# Patient Record
Sex: Male | Born: 1970 | Race: Black or African American | Hispanic: No | Marital: Married | Smoking: Current every day smoker
Health system: Southern US, Community
[De-identification: ages and names within clinical notes are randomized; demographics above are authoritative.]

## PROBLEM LIST (undated history)

## (undated) DIAGNOSIS — J45909 Unspecified asthma, uncomplicated: Secondary | ICD-10-CM

## (undated) DIAGNOSIS — I10 Essential (primary) hypertension: Secondary | ICD-10-CM

## (undated) DIAGNOSIS — F329 Major depressive disorder, single episode, unspecified: Secondary | ICD-10-CM

## (undated) DIAGNOSIS — F32A Depression, unspecified: Secondary | ICD-10-CM

## (undated) HISTORY — PX: HERNIA REPAIR: SHX51

---
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
## 2020-01-10 ENCOUNTER — Other Ambulatory Visit: Payer: Self-pay

## 2020-01-10 ENCOUNTER — Emergency Department (HOSPITAL_COMMUNITY)
Admission: EM | Admit: 2020-01-10 | Discharge: 2020-01-10 | Disposition: A | Discharge status: Home / Self Care | Payer: Self-pay | Attending: Emergency Medicine | Admitting: Emergency Medicine

## 2020-01-10 ENCOUNTER — Encounter (HOSPITAL_COMMUNITY): Payer: Self-pay

## 2020-01-10 DIAGNOSIS — Z20822 Contact with and (suspected) exposure to covid-19: Secondary | ICD-10-CM | POA: Insufficient documentation

## 2020-01-10 DIAGNOSIS — J069 Acute upper respiratory infection, unspecified: Secondary | ICD-10-CM | POA: Insufficient documentation

## 2020-01-10 DIAGNOSIS — J45909 Unspecified asthma, uncomplicated: Secondary | ICD-10-CM | POA: Insufficient documentation

## 2020-01-10 DIAGNOSIS — F17210 Nicotine dependence, cigarettes, uncomplicated: Secondary | ICD-10-CM | POA: Insufficient documentation

## 2020-01-10 HISTORY — DX: Unspecified asthma, uncomplicated: J45.909

## 2020-01-10 HISTORY — DX: Depression, unspecified: F32.A

## 2020-01-10 LAB — POC SARS CORONAVIRUS 2 AG -  ED: SARS Coronavirus 2 Ag: NEGATIVE

## 2020-01-10 LAB — SARS CORONAVIRUS 2 (TAT 6-24 HRS): SARS Coronavirus 2: NEGATIVE

## 2020-01-10 NOTE — ED Triage Notes (Signed)
Patient c/o a non productive cough, chills, fatigue/weakness since yesterday.

## 2020-01-10 NOTE — ED Provider Notes (Signed)
[facility] DEPT Provider Note   CSN: [id number] Arrival date & time: 01/10/20  6812     History Chief Complaint  Patient presents with  . Chills  . Fatigue  . Cough    Mason Zuniga is a 49 y.o. male.  HPI Patient presents with cough chills and fatigue.  Began yesterday.  No known sick contacts.  History of asthma.  No production with cough.  Has not had to use inhaler.  Patient is worried he could have Covid.  Although he has had no known contacts.  No chest pain.    Past Medical History:  Diagnosis Date  . Asthma   . Depression     There are no problems to display for this patient.   Past Surgical History:  Procedure Laterality Date  . HERNIA REPAIR         Family History  Problem Relation Age of Onset  . Hypertension Mother     Social History   Tobacco Use  . Smoking status: Current Every Day Smoker    Packs/day: 1.00    Types: Cigarettes  . Smokeless tobacco: Never Used  Substance Use Topics  . Alcohol use: Not Currently  . Drug use: Not Currently    Home Medications Prior to Admission medications   Not on File    Allergies    Zoloft [sertraline]  Review of Systems   Review of Systems  Constitutional: Positive for appetite change, chills and fatigue.  HENT: Negative for congestion.   Respiratory: Positive for cough and shortness of breath.   Cardiovascular: Negative for chest pain and leg swelling.  Gastrointestinal: Negative for abdominal pain.  Genitourinary: Negative for flank pain.  Musculoskeletal: Negative for back pain.  Neurological: Negative for weakness.  Psychiatric/Behavioral: Negative for confusion.    Physical Exam Updated Vital Signs BP 139/89 (BP Location: Right Arm)   Pulse 76   Temp 98.2 F (36.8 C) (Oral)   Resp 16   Ht 5\' 11"  (1.803 m)   Wt 83.9 kg   SpO2 99%   BMI 25.80 kg/m   Physical Exam Vitals and nursing note reviewed.  HENT:     Head: Atraumatic.  Cardiovascular:      Rate and Rhythm: Regular rhythm.  Pulmonary:     Breath sounds: No wheezing, rhonchi or rales.  Abdominal:     Tenderness: There is no abdominal tenderness.  Musculoskeletal:     Cervical back: Neck supple.     Right lower leg: No edema.     Left lower leg: No edema.  Skin:    General: Skin is warm.     Capillary Refill: Capillary refill takes less than 2 seconds.  Neurological:     Mental Status: He is alert and oriented to person, place, and time.  Psychiatric:        Mood and Affect: Mood normal.     ED Results / Procedures / Treatments   Labs (all labs ordered are listed, but only abnormal results are displayed) Labs Reviewed  SARS CORONAVIRUS 2 (TAT 6-24 HRS)  POC SARS CORONAVIRUS 2 AG -  ED    EKG None  Radiology No results found.  Procedures Procedures (including critical care time)  Medications Ordered in ED Medications - No data to display  ED Course  I have reviewed the triage vital signs and the nursing notes.  Pertinent labs & imaging results that were available during my care of the patient were reviewed by me and considered  in my medical decision making (see chart for details).    MDM Rules/Calculators/A&P                      Patient with URI symptoms.  No known Covid exposure.  Negative antigen test.  PCR test ordered.  Well-appearing and appears stable for discharge.  Instructed to isolate.  Discharge. Final Clinical Impression(s) / ED Diagnoses Final diagnoses:  Upper respiratory tract infection, unspecified type    Rx / DC Orders ED Discharge Orders    None       Benjiman Core, MD 01/10/20 [phone]

## 2020-01-10 NOTE — ED Notes (Signed)
Pt verbalizes understanding of DC instructions. Pt belongings returned and is ambulatory out of ED.  

## 2020-02-24 ENCOUNTER — Emergency Department (HOSPITAL_BASED_OUTPATIENT_CLINIC_OR_DEPARTMENT_OTHER)
Admission: EM | Admit: 2020-02-24 | Discharge: 2020-02-25 | Disposition: A | Discharge status: Home / Self Care | Payer: Self-pay | Attending: Emergency Medicine | Admitting: Emergency Medicine

## 2020-02-24 ENCOUNTER — Emergency Department (HOSPITAL_BASED_OUTPATIENT_CLINIC_OR_DEPARTMENT_OTHER): Payer: Self-pay

## 2020-02-24 ENCOUNTER — Encounter (HOSPITAL_BASED_OUTPATIENT_CLINIC_OR_DEPARTMENT_OTHER): Payer: Self-pay | Admitting: Emergency Medicine

## 2020-02-24 ENCOUNTER — Other Ambulatory Visit: Payer: Self-pay

## 2020-02-24 DIAGNOSIS — Z20822 Contact with and (suspected) exposure to covid-19: Secondary | ICD-10-CM | POA: Diagnosis not present

## 2020-02-24 DIAGNOSIS — I959 Hypotension, unspecified: Secondary | ICD-10-CM | POA: Diagnosis not present

## 2020-02-24 DIAGNOSIS — R55 Syncope and collapse: Secondary | ICD-10-CM | POA: Insufficient documentation

## 2020-02-24 DIAGNOSIS — R531 Weakness: Secondary | ICD-10-CM | POA: Diagnosis not present

## 2020-02-24 DIAGNOSIS — J45909 Unspecified asthma, uncomplicated: Secondary | ICD-10-CM | POA: Insufficient documentation

## 2020-02-24 DIAGNOSIS — R001 Bradycardia, unspecified: Secondary | ICD-10-CM | POA: Diagnosis not present

## 2020-02-24 DIAGNOSIS — F12929 Cannabis use, unspecified with intoxication, unspecified: Secondary | ICD-10-CM | POA: Insufficient documentation

## 2020-02-24 DIAGNOSIS — F12920 Cannabis use, unspecified with intoxication, uncomplicated: Secondary | ICD-10-CM

## 2020-02-24 DIAGNOSIS — F12120 Cannabis abuse with intoxication, uncomplicated: Secondary | ICD-10-CM | POA: Diagnosis not present

## 2020-02-24 DIAGNOSIS — F17210 Nicotine dependence, cigarettes, uncomplicated: Secondary | ICD-10-CM | POA: Insufficient documentation

## 2020-02-24 LAB — RAPID URINE DRUG SCREEN, HOSP PERFORMED
Amphetamines: NOT DETECTED
Barbiturates: NOT DETECTED
Benzodiazepines: NOT DETECTED
Cocaine: NOT DETECTED
Opiates: NOT DETECTED
Tetrahydrocannabinol: POSITIVE — AB

## 2020-02-24 LAB — CBC
HCT: 43.3 % (ref 39.0–52.0)
Hemoglobin: 14 g/dL (ref 13.0–17.0)
MCH: 28.7 pg (ref 26.0–34.0)
MCHC: 32.3 g/dL (ref 30.0–36.0)
MCV: 88.7 fL (ref 80.0–100.0)
Platelets: 155 10*3/uL (ref 150–400)
RBC: 4.88 MIL/uL (ref 4.22–5.81)
RDW: 13.1 % (ref 11.5–15.5)
WBC: 6 10*3/uL (ref 4.0–10.5)
nRBC: 0 % (ref 0.0–0.2)

## 2020-02-24 LAB — BASIC METABOLIC PANEL
Anion gap: 8 (ref 5–15)
BUN: 11 mg/dL (ref 6–20)
CO2: 27 mmol/L (ref 22–32)
Calcium: 8.1 mg/dL — ABNORMAL LOW (ref 8.9–10.3)
Chloride: 104 mmol/L (ref 98–111)
Creatinine, Ser: 1.08 mg/dL (ref 0.61–1.24)
GFR calc Af Amer: >60 mL/min (ref >60)
GFR calc non Af Amer: >60 mL/min (ref >60)
Glucose, Bld: 138 mg/dL — ABNORMAL HIGH (ref 70–99)
Potassium: 3.6 mmol/L (ref 3.5–5.1)
Sodium: 139 mmol/L (ref 135–145)

## 2020-02-24 LAB — TROPONIN I (HIGH SENSITIVITY): Troponin I (High Sensitivity): <2 ng/L (ref <18)

## 2020-02-24 MED ORDER — SODIUM CHLORIDE 0.9 % IV BOLUS
1000.0000 mL | Freq: Once | INTRAVENOUS | Status: AC
  Administered 2020-02-24: 1000 mL via INTRAVENOUS

## 2020-02-24 NOTE — Discharge Instructions (Addendum)
You were seen in the ER today after an episode of lightheadedness and near loss of consciousness.  Your workup was reassuring in the ER, and did not reveal any life-threatening emergencies.  Specifically, we did not see signs of a heart attack, arrhythmia, anemia, or significant dehydration in your bloodwork.  It is possible your dizziness and weakness was related to marijuana, which can have these kind of effects.  We discussed testing you for COVID because of your fatigue and loss of appetite - that test will result in 1-2 days.  I advised that you rest at home and keep hydrated.  I also advised you follow up with a cardiologist for your episode. Your heart doctor may want to perform additional testing, like an echocardiogram.  You should try to schedule an appointment in the 5-7 days if they have availability.

## 2020-02-24 NOTE — ED Triage Notes (Signed)
Reports being at family get together this evening when he had a sudden onset of lethargy and hypotension.  Endorses smoking 1 blunt tonight.  Hx of drug overdose.  EMS reports pinpoint pupils.  Denies any other drug use tonight.  Reports drinking 1 smirnoff.

## 2020-02-24 NOTE — ED Provider Notes (Signed)
[facility] EMERGENCY DEPARTMENT Provider Note   CSN: [id number] Arrival date & time: 02/24/20  2204     History CC: fatigue  Rocky Gladden is a 49 y.o. male presenting to the ED with weakness and near-syncope.  He reports he's felt tired all day.  He was at a birthday celebration with his daughters and his "wife" or partner Pearlean Brownie [phone]), and that he had "two hits off a blunt," then was standing up to go outside to smoke a cigarette, and became very lightheaded and fell over.  His wife states "he looked like a zombie, really groggy, and just fell over."  She says he was "sweaty all over" after his fall, but there was no tonic-clonic activity.  He has no hx of seizures.  He denies he has ever had syncope before.  He believes he's dehydrated, and tells me he doesn't drink much water.  He denies any other drug use tonight, or drinking any alcohol.  He denies any cardiac hx .    In the Ed the patient is asymptomatic.  HPI     Past Medical History:  Diagnosis Date  . Asthma   . Depression     There are no problems to display for this patient.   Past Surgical History:  Procedure Laterality Date  . HERNIA REPAIR         Family History  Problem Relation Age of Onset  . Hypertension Mother     Social History   Tobacco Use  . Smoking status: Current Every Day Smoker    Packs/day: 1.00    Types: Cigarettes  . Smokeless tobacco: Never Used  Substance Use Topics  . Alcohol use: Yes    Alcohol/week: 3.0 standard drinks    Types: 3 Cans of beer per week    Comment: occasionally  . Drug use: Yes    Types: Marijuana, Cocaine    Home Medications Prior to Admission medications   Not on File    Allergies    Zoloft [sertraline]  Review of Systems   Review of Systems  Constitutional: Positive for fatigue. Negative for chills and fever.  Eyes: Negative for pain and visual disturbance.  Respiratory: Negative for cough and shortness of breath.     Cardiovascular: Negative for chest pain and palpitations.  Gastrointestinal: Negative for abdominal pain and vomiting.  Skin: Negative for pallor and rash.  Neurological: Positive for light-headedness. Negative for seizures, syncope, facial asymmetry, weakness and numbness.  All other systems reviewed and are negative.   Physical Exam Updated Vital Signs BP 114/68   Pulse (!) 55   Temp (!) 97.3 F (36.3 C) (Oral)   Resp 13   Ht 5\' 11"  (1.803 m)   Wt 83.9 kg   SpO2 96%   BMI 25.80 kg/m   Physical Exam Vitals and nursing note reviewed.  Constitutional:      Appearance: He is well-developed.  HENT:     Head: Normocephalic and atraumatic. No raccoon eyes, abrasion, masses, right periorbital erythema or left periorbital erythema.     Jaw: There is normal jaw occlusion.      Comments: Cyst on forehead (chronic) Eyes:     Conjunctiva/sclera: Conjunctivae normal.     Comments: Pinpoint pupils bilaterally  Cardiovascular:     Rate and Rhythm: Normal rate and regular rhythm.     Pulses: Normal pulses.  Pulmonary:     Effort: Pulmonary effort is normal. No respiratory distress.     Breath sounds:  Normal breath sounds.  Abdominal:     General: There is no distension.     Palpations: Abdomen is soft.     Tenderness: There is no abdominal tenderness.  Musculoskeletal:     Cervical back: Neck supple.  Skin:    General: Skin is warm and dry.  Neurological:     General: No focal deficit present.     Mental Status: He is alert and oriented to person, place, and time.  Psychiatric:        Mood and Affect: Mood normal.        Behavior: Behavior normal.     ED Results / Procedures / Treatments   Labs (all labs ordered are listed, but only abnormal results are displayed) Labs Reviewed  RAPID URINE DRUG SCREEN, HOSP PERFORMED - Abnormal; Notable for the following components:      Result Value   Tetrahydrocannabinol POSITIVE (*)    All other components within normal limits   BASIC METABOLIC PANEL - Abnormal; Notable for the following components:   Glucose, Bld 138 (*)    Calcium 8.1 (*)    All other components within normal limits  NOVEL CORONAVIRUS, NAA (HOSP ORDER, SEND-OUT TO REF LAB; TAT 18-24 HRS)  CBC  TROPONIN I (HIGH SENSITIVITY)    EKG EKG Interpretation  Date/Time:  Saturday February 24 2020 22:16:52 EST Ventricular Rate:  58 PR Interval:    QRS Duration: 75 QT Interval:  417 QTC Calculation: 410 R Axis:   77 Text Interpretation: Sinus rhythm Prolonged PR interval Consider left ventricular hypertrophy No STEMI Confirmed by Alvester Chou [phone]) on 02/24/2020 10:37:45 PM   Radiology DG Chest Portable 1 View  Result Date: 02/24/2020 CLINICAL DATA:  Near syncope, lethargy EXAM: PORTABLE CHEST 1 VIEW COMPARISON:  None. FINDINGS: The heart size and mediastinal contours are within normal limits. Both lungs are clear. The visualized skeletal structures are unremarkable. IMPRESSION: No active disease. Electronically Signed   By: Sharlet Salina M.D.   On: 02/24/2020 22:46    Procedures Procedures (including critical care time)  Medications Ordered in ED Medications  sodium chloride 0.9 % bolus 1,000 mL (1,000 mLs Intravenous New Bag/Given 02/24/20 2240)    ED Course  I have reviewed the triage vital signs and the nursing notes.  Pertinent labs & imaging results that were available during my care of the patient were reviewed by me and considered in my medical decision making (see chart for details).  49 yo male presenting to ED with near-syncope, which occurred after standing up from sitting, and in the setting of recent smoking marijuana for the first time in a long time.  Orthostatic VS unremarkable here, but patient also received IVF from EMS prior to arrival.    He is asymptomatic here, looks tired but otherwise has no acute findings on physical exam No evidence of aortic stenosis ECG benign, trop undetectable, DG chest clear No  anemia No significant dehydration per labwork  Suspect this was likely marijuana-induced, with some orthostasis earlier tonight  He is low risk for near-syncope per the [place] syncope rule (score of 0).  I advised that he follow up with a cardiologist regardless for outpatient evaluation in the next week, and he told me he would do so.  Advised rest for the next few days.  We'll also do a covid send-out test as he has had a loss of appetite and fatigue all day.  Esli Jernigan was evaluated in Emergency Department on 02/24/2020 for the symptoms  described in the history of present illness. He was evaluated in the context of the global COVID-19 pandemic, which necessitated consideration that the patient might be at risk for infection with the SARS-CoV-2 virus that causes COVID-19. Institutional protocols and algorithms that pertain to the evaluation of patients at risk for COVID-19 are in a state of rapid change based on information released by regulatory bodies including the CDC and federal and state organizations. These policies and algorithms were followed during the patient's care in the ED.   Final Clinical Impression(s) / ED Diagnoses Final diagnoses:  Near syncope  Cannabis intoxication without complication [facility])    Rx / DC Orders ED Discharge Orders    None       Azion Centrella, Carola Rhine, MD 02/24/20 2339

## 2020-02-25 LAB — SARS CORONAVIRUS 2 (TAT 6-24 HRS): SARS Coronavirus 2: NEGATIVE

## 2020-02-25 NOTE — ED Notes (Signed)
Pt's wife giving him a ride home. Assisted pt to car in wheelchair.

## 2020-02-29 ENCOUNTER — Ambulatory Visit (INDEPENDENT_AMBULATORY_CARE_PROVIDER_SITE_OTHER): Payer: BC Managed Care – PPO | Admitting: Cardiology

## 2020-02-29 ENCOUNTER — Other Ambulatory Visit: Payer: Self-pay

## 2020-02-29 ENCOUNTER — Encounter: Payer: Self-pay | Admitting: *Deleted

## 2020-02-29 ENCOUNTER — Encounter: Payer: Self-pay | Admitting: Cardiology

## 2020-02-29 VITALS — BP 120/78 | HR 63 | Temp 97.0°F | Ht 71.0 in | Wt 183.0 lb

## 2020-02-29 DIAGNOSIS — R9431 Abnormal electrocardiogram [ECG] [EKG]: Secondary | ICD-10-CM | POA: Diagnosis not present

## 2020-02-29 DIAGNOSIS — R55 Syncope and collapse: Secondary | ICD-10-CM | POA: Diagnosis not present

## 2020-02-29 DIAGNOSIS — I10 Essential (primary) hypertension: Secondary | ICD-10-CM | POA: Diagnosis not present

## 2020-02-29 DIAGNOSIS — R0681 Apnea, not elsewhere classified: Secondary | ICD-10-CM | POA: Diagnosis not present

## 2020-02-29 DIAGNOSIS — Z720 Tobacco use: Secondary | ICD-10-CM

## 2020-02-29 NOTE — Progress Notes (Signed)
Cardiology Office Note:    Date:  02/29/2020   ID:  Mason Zuniga, DOB 03/16/1971, MRN [id number]  PCP:  Patient, No Pcp Per  Cardiologist:  No primary care provider on file.  Electrophysiologist:  None   Referring MD: Terald Sleeper, MD   Chief Complaint  Patient presents with  . Loss of Consciousness    History of Present Illness:    Mason Zuniga is a 49 y.o. male with a hx of asthma, depression, hypertension who presents for an ED follow-up for syncope.  He had a recent syncopal episode.  He was at his daughter's birthday party and reports that he drank a few shots and used marijuana for the first time in years.  Stood up to walk outside to smoke a cigarette and passed out.  Family witnessed him passing out.  Reports that he was out for a few seconds.  Came to you and tried to stand up and then passed out again.  Both episodes occurred with position change.  In the ED, he was asymptomatic.  Orthostatics were negative, but he had received IV fluids from EMS.  Troponin was undetectable.  Labs unremarkable.  He denies any chest pain, dyspnea, or palpitations preceding the episode.  He reports he is usually very active, will play basketball on occasion.  Denies any exertional chest pain.  Does report he gets dyspneic with playing basketball, will use his inhaler and improves.  Denies any palpitations.  He had 1 prior syncopal episode, 20 years ago at a club.  Reports that he had been smoking marijuana that night as well.  He was in prison for 20 years and was released last year.  He reports that his wife notes that he stops breathing at night.  Smokes 1ppd, stopped for 20 years while in prison, started smoking again last year.  Father died of MI in 68s.    Past Medical History:  Diagnosis Date  . Asthma   . Depression     Past Surgical History:  Procedure Laterality Date  . HERNIA REPAIR      Current Medications: No outpatient medications have been marked as taking for the 02/29/20  encounter (Office Visit) with Little Ishikawa, MD.     Allergies:   Zoloft [sertraline]   Social History   Socioeconomic History  . Marital status: Single    Spouse name: Not on file  . Number of children: Not on file  . Years of education: Not on file  . Highest education level: Not on file  Occupational History  . Not on file  Tobacco Use  . Smoking status: Current Every Day Smoker    Packs/day: 1.00    Types: Cigarettes  . Smokeless tobacco: Never Used  Substance and Sexual Activity  . Alcohol use: Yes    Alcohol/week: 3.0 standard drinks    Types: 3 Cans of beer per week    Comment: occasionally  . Drug use: Yes    Types: Marijuana, Cocaine  . Sexual activity: Not on file  Other Topics Concern  . Not on file  Social History Narrative  . Not on file   Social Determinants of Health   Financial Resource Strain:   . Difficulty of Paying Living Expenses: Not on file  Food Insecurity:   . Worried About Programme researcher, broadcasting/film/video in the Last Year: Not on file  . Ran Out of Food in the Last Year: Not on file  Transportation Needs:   . Lack of  Transportation (Medical): Not on file  . Lack of Transportation (Non-Medical): Not on file  Physical Activity:   . Days of Exercise per Week: Not on file  . Minutes of Exercise per Session: Not on file  Stress:   . Feeling of Stress : Not on file  Social Connections:   . Frequency of Communication with Friends and Family: Not on file  . Frequency of Social Gatherings with Friends and Family: Not on file  . Attends Religious Services: Not on file  . Active Member of Clubs or Organizations: Not on file  . Attends Banker Meetings: Not on file  . Marital Status: Not on file     Family History: The patient's family history includes Hypertension in his mother.  ROS:   Please see the history of present illness.     All other systems reviewed and are negative.  EKGs/Labs/Other Studies Reviewed:    The  following studies were reviewed today:   EKG:  EKG is  ordered today.  The ekg ordered today demonstrates normal sinus rhythm, rate 63, LVH, Q waves in I, aVL, V1/2  Recent Labs: 02/24/2020: BUN 11; Creatinine, Ser 1.08; Hemoglobin 14.0; Platelets 155; Potassium 3.6; Sodium 139  Recent Lipid Panel No results found for: CHOL, TRIG, HDL, CHOLHDL, VLDL, LDLCALC, LDLDIRECT  Physical Exam:    VS:  BP 120/78   Pulse 63   Temp (!) 97 F (36.1 C)   Ht 5\' 11"  (1.803 m)   Wt 183 lb (83 kg)   SpO2 97%   BMI 25.52 kg/m     Wt Readings from Last 3 Encounters:  02/29/20 183 lb (83 kg)  02/24/20 185 lb (83.9 kg)  01/10/20 185 lb (83.9 kg)     GEN:  Well nourished, well developed in no acute distress HEENT: Normal NECK: No JVD; No carotid bruits LYMPHATICS: No lymphadenopathy CARDIAC: RRR, no murmurs, rubs, gallops RESPIRATORY:  Clear to auscultation without rales, wheezing or rhonchi  ABDOMEN: Soft, non-tender, non-distended MUSCULOSKELETAL:  No edema; No deformity  SKIN: Warm and dry NEUROLOGIC:  Alert and oriented x 3 PSYCHIATRIC:  Normal affect   ASSESSMENT:    1. Syncope, unspecified syncope type   2. Witnessed apneic spells   3. Essential hypertension   4. Nonspecific abnormal electrocardiogram (ECG) (EKG)   5. Tobacco use    PLAN:     Syncope: Suspect orthostasis, likely dehydrated in setting of alcohol/marijuana use.  Will check TTE to rule out structural heart disease.  Will check Zio patch x2 weeks to rule out arrhythmia.  Hypertension: Reports history of hypertension but has not been on any medications recently.  Normal BP in clinic today.  LVH on EKG, will check TTE  EKG abnormalities: Q waves in I, aVL, V1/2.  Check TTE  Suspected OSA: Wife reports that he seems to stop breathing while he sleeps.  Will check sleep study  Tobacco use smokes 1 pack/day.  Encourage cessation  RTC in 3 months  Medication Adjustments/Labs and Tests Ordered: Current medicines are  reviewed at length with the patient today.  Concerns regarding medicines are outlined above.  Orders Placed This Encounter  Procedures  . LONG TERM MONITOR (3-14 DAYS)  . EKG 12-Lead  . ECHOCARDIOGRAM COMPLETE  . Split night study   No orders of the defined types were placed in this encounter.   Patient Instructions  Medication Instructions:  Your physician recommends that you continue on your current medications as directed. Please refer to the  Current Medication list given to you today.  *If you need a refill on your cardiac medications before your next appointment, please call your pharmacy*   Lab Work: NONE  Testing/Procedures: Your physician has requested that you have an echocardiogram. Echocardiography is a painless test that uses sound waves to create images of your heart. It provides your doctor with information about the size and shape of your heart and how well your heart's chambers and valves are working. This procedure takes approximately one hour. There are no restrictions for this procedure.  This will be done at our [facility] location:  [address]   ZIO XT- Long Term Monitor Instructions   Your physician has requested you wear your ZIO patch monitor___14___days.   This is a single patch monitor.  [employer] supplies one patch monitor per enrollment.  Additional stickers are not available.   Please do not apply patch if you will be having a Nuclear Stress Test, Echocardiogram, Cardiac CT, MRI, or Chest Xray during the time frame you would be wearing the monitor. The patch cannot be worn during these tests.  You cannot remove and re-apply the ZIO XT patch monitor.   Your ZIO patch monitor will be sent USPS Priority mail from [facility] directly to your home address. The monitor may also be mailed to a PO BOX if home delivery is not available.   It may take 3-5 days to receive your monitor after you have been enrolled.   Once you have  received you monitor, please review enclosed instructions.  Your monitor has already been registered assigning a specific monitor serial # to you.   Applying the monitor   Shave hair from upper left chest.   Hold abrader disc by orange tab.  Rub abrader in 40 strokes over left upper chest as indicated in your monitor instructions.   Clean area with 4 enclosed alcohol pads .  Use all pads to assure are is cleaned thoroughly.  Let dry.   Apply patch as indicated in monitor instructions.  Patch will be place under collarbone on left side of chest with arrow pointing upward.   Rub patch adhesive wings for 2 minutes.Remove white label marked "1".  Remove white label marked "2".  Rub patch adhesive wings for 2 additional minutes.   While looking in a mirror, press and release button in center of patch.  A small green light will flash 3-4 times .  This will be your only indicator the monitor has been turned on.     Do not shower for the first 24 hours.  You may shower after the first 24 hours.   Press button if you feel a symptom. You will hear a small click.  Record Date, Time and Symptom in the Patient Log Book.   When you are ready to remove patch, follow instructions on last 2 pages of Patient Log Book.  Stick patch monitor onto last page of Patient Log Book.   Place Patient Log Book in [place] box.  Use locking tab on box and tape box closed securely.  The [employer] has [employer] on it.  Please place in mailbox as soon as possible.  Your physician should have your test results approximately 7 days after the monitor has been mailed back to [facility].   Call [facility] Customer Care at [phone] if you have questions regarding your ZIO XT patch monitor.  Call them immediately if you see an orange  light blinking on your monitor.   If your monitor falls off in less than 4 days contact our Monitor department at [phone].  If your monitor becomes loose or falls off  after 4 days call [employer] at [phone] for suggestions on securing your monitor.    Your physician has recommended that you have a sleep study. This test records several body functions during sleep, including: brain activity, eye movement, oxygen and carbon dioxide blood levels, heart rate and rhythm, breathing rate and rhythm, the flow of air through your mouth and nose, snoring, body muscle movements, and chest and belly movement. --this must be approved by insurance prior to scheduling   Follow-Up: At [facility], you and your health needs are our priority.  As part of our continuing mission to provide you with exceptional heart care, we have created designated Provider Care Teams.  These Care Teams include your primary Cardiologist (physician) and Advanced Practice Providers (APPs -  Physician Assistants and Nurse Practitioners) who all work together to provide you with the care you need, when you need it.  We recommend signing up for the patient portal called "MyChart".  Sign up information is provided on this After Visit Summary.  MyChart is used to connect with patients for Virtual Visits (Telemedicine).  Patients are able to view lab/test results, encounter notes, upcoming appointments, etc.  Non-urgent messages can be sent to your provider as well.   To learn more about what you can do with MyChart, go to [email or web address].    Your next appointment:   3 month(s)  The format for your next appointment:   Either In Person or Virtual  Provider:   Oswaldo Milian, MD       Signed, Donato Heinz, MD  02/29/2020 4:59 PM    [place]

## 2020-02-29 NOTE — Progress Notes (Signed)
Patient ID: Mason Zuniga, male   DOB: Oct 19, 1971, 49 y.o.   MRN: [id number] Patient enrolled for [employer] to mail a 14 day ZIO XT long term holter monitor to his home.

## 2020-02-29 NOTE — Patient Instructions (Addendum)
Medication Instructions:  Your physician recommends that you continue on your current medications as directed. Please refer to the Current Medication list given to you today.  *If you need a refill on your cardiac medications before your next appointment, please call your pharmacy*   Lab Work: NONE  Testing/Procedures: Your physician has requested that you have an echocardiogram. Echocardiography is a painless test that uses sound waves to create images of your heart. It provides your doctor with information about the size and shape of your heart and how well your heart's chambers and valves are working. This procedure takes approximately one hour. There are no restrictions for this procedure.  This will be done at our [facility] location:  [address]   ZIO XT- Long Term Monitor Instructions   Your physician has requested you wear your ZIO patch monitor___14___days.   This is a single patch monitor.  [employer] supplies one patch monitor per enrollment.  Additional stickers are not available.   Please do not apply patch if you will be having a Nuclear Stress Test, Echocardiogram, Cardiac CT, MRI, or Chest Xray during the time frame you would be wearing the monitor. The patch cannot be worn during these tests.  You cannot remove and re-apply the ZIO XT patch monitor.   Your ZIO patch monitor will be sent USPS Priority mail from [facility] directly to your home address. The monitor may also be mailed to a PO BOX if home delivery is not available.   It may take 3-5 days to receive your monitor after you have been enrolled.   Once you have received you monitor, please review enclosed instructions.  Your monitor has already been registered assigning a specific monitor serial # to you.   Applying the monitor   Shave hair from upper left chest.   Hold abrader disc by orange tab.  Rub abrader in 40 strokes over left upper chest as indicated in your monitor  instructions.   Clean area with 4 enclosed alcohol pads .  Use all pads to assure are is cleaned thoroughly.  Let dry.   Apply patch as indicated in monitor instructions.  Patch will be place under collarbone on left side of chest with arrow pointing upward.   Rub patch adhesive wings for 2 minutes.Remove white label marked "1".  Remove white label marked "2".  Rub patch adhesive wings for 2 additional minutes.   While looking in a mirror, press and release button in center of patch.  A small green light will flash 3-4 times .  This will be your only indicator the monitor has been turned on.     Do not shower for the first 24 hours.  You may shower after the first 24 hours.   Press button if you feel a symptom. You will hear a small click.  Record Date, Time and Symptom in the Patient Log Book.   When you are ready to remove patch, follow instructions on last 2 pages of Patient Log Book.  Stick patch monitor onto last page of Patient Log Book.   Place Patient Log Book in [place] box.  Use locking tab on box and tape box closed securely.  The [employer] and [employer] has [employer] on it.  Please place in mailbox as soon as possible.  Your physician should have your test results approximately 7 days after the monitor has been mailed back to [facility].   Call [facility] Customer Care at [phone]  if you have questions regarding your ZIO XT patch monitor.  Call them immediately if you see an orange light blinking on your monitor.   If your monitor falls off in less than 4 days contact our Monitor department at [phone].  If your monitor becomes loose or falls off after 4 days call [employer] at [phone] for suggestions on securing your monitor.    Your physician has recommended that you have a sleep study. This test records several body functions during sleep, including: brain activity, eye movement, oxygen and carbon dioxide blood levels, heart rate and rhythm, breathing rate  and rhythm, the flow of air through your mouth and nose, snoring, body muscle movements, and chest and belly movement. --this must be approved by insurance prior to scheduling   Follow-Up: At [facility], you and your health needs are our priority.  As part of our continuing mission to provide you with exceptional heart care, we have created designated Provider Care Teams.  These Care Teams include your primary Cardiologist (physician) and Advanced Practice Providers (APPs -  Physician Assistants and Nurse Practitioners) who all work together to provide you with the care you need, when you need it.  We recommend signing up for the patient portal called "MyChart".  Sign up information is provided on this After Visit Summary.  MyChart is used to connect with patients for Virtual Visits (Telemedicine).  Patients are able to view lab/test results, encounter notes, upcoming appointments, etc.  Non-urgent messages can be sent to your provider as well.   To learn more about what you can do with MyChart, go to [email or web address].    Your next appointment:   3 month(s)  The format for your next appointment:   Either In Person or Virtual  Provider:   Oswaldo Milian, MD

## 2020-03-04 ENCOUNTER — Telehealth: Payer: Self-pay | Admitting: *Deleted

## 2020-03-04 NOTE — Telephone Encounter (Signed)
Left message to return a call to discuss sleep study appointment. 

## 2020-03-05 ENCOUNTER — Ambulatory Visit (INDEPENDENT_AMBULATORY_CARE_PROVIDER_SITE_OTHER): Payer: BC Managed Care – PPO

## 2020-03-05 DIAGNOSIS — R55 Syncope and collapse: Secondary | ICD-10-CM | POA: Diagnosis not present

## 2020-03-05 NOTE — Telephone Encounter (Signed)
Patient returned a call and was given sleep study and COVID appointment details. Patient voiced understanding of quarantine instructions.

## 2020-03-18 ENCOUNTER — Other Ambulatory Visit: Payer: Self-pay

## 2020-03-18 ENCOUNTER — Other Ambulatory Visit (HOSPITAL_COMMUNITY)
Admission: RE | Admit: 2020-03-18 | Discharge: 2020-03-18 | Disposition: A | Discharge status: Home / Self Care | Payer: BC Managed Care – PPO | Source: Ambulatory Visit | Attending: Cardiovascular Disease | Admitting: Cardiovascular Disease

## 2020-03-18 ENCOUNTER — Ambulatory Visit (HOSPITAL_BASED_OUTPATIENT_CLINIC_OR_DEPARTMENT_OTHER): Payer: BC Managed Care – PPO

## 2020-03-18 DIAGNOSIS — F17210 Nicotine dependence, cigarettes, uncomplicated: Secondary | ICD-10-CM | POA: Insufficient documentation

## 2020-03-18 DIAGNOSIS — I10 Essential (primary) hypertension: Secondary | ICD-10-CM | POA: Diagnosis not present

## 2020-03-18 DIAGNOSIS — R55 Syncope and collapse: Secondary | ICD-10-CM | POA: Diagnosis not present

## 2020-03-18 DIAGNOSIS — Z20822 Contact with and (suspected) exposure to covid-19: Secondary | ICD-10-CM | POA: Diagnosis not present

## 2020-03-18 DIAGNOSIS — Z01818 Encounter for other preprocedural examination: Secondary | ICD-10-CM | POA: Insufficient documentation

## 2020-03-18 LAB — SARS CORONAVIRUS 2 (TAT 6-24 HRS): SARS Coronavirus 2: NEGATIVE

## 2020-03-20 ENCOUNTER — Encounter (HOSPITAL_BASED_OUTPATIENT_CLINIC_OR_DEPARTMENT_OTHER): Payer: Self-pay | Admitting: Cardiovascular Disease

## 2020-03-20 ENCOUNTER — Other Ambulatory Visit: Payer: Self-pay

## 2020-03-20 ENCOUNTER — Ambulatory Visit (HOSPITAL_BASED_OUTPATIENT_CLINIC_OR_DEPARTMENT_OTHER): Payer: BC Managed Care – PPO | Attending: Cardiology | Admitting: Cardiovascular Disease

## 2020-03-20 DIAGNOSIS — R0902 Hypoxemia: Secondary | ICD-10-CM | POA: Insufficient documentation

## 2020-03-20 DIAGNOSIS — G4733 Obstructive sleep apnea (adult) (pediatric): Secondary | ICD-10-CM | POA: Diagnosis not present

## 2020-03-20 DIAGNOSIS — R0681 Apnea, not elsewhere classified: Secondary | ICD-10-CM | POA: Insufficient documentation

## 2020-03-20 HISTORY — DX: Apnea, not elsewhere classified: R06.81

## 2020-03-25 ENCOUNTER — Encounter (HOSPITAL_BASED_OUTPATIENT_CLINIC_OR_DEPARTMENT_OTHER): Payer: Self-pay | Admitting: Cardiovascular Disease

## 2020-03-25 NOTE — Procedures (Signed)
      Patient Name: Mason Zuniga, Mason Zuniga Date: 03/20/2020 Gender: Male D.O.B: 11-Mar-1971 Age (years): 48 Referring Provider: Epifanio Lesches Height (inches): 71 Interpreting Physician: Nicki Guadalajara MD, ABSM Weight (lbs): 185 RPSGT: Ulyess Mort BMI: 26 MRN: [id number] Neck Size: 14.50  CLINICAL INFORMATION Sleep Study Type: NPSG  Indication for sleep study: Excessive Daytime Sleepiness, Fatigue, Snoring, Witnessed Apneas  Epworth Sleepiness Score: 6  SLEEP STUDY TECHNIQUE As per the AASM Manual for the Scoring of Sleep and Associated Events v2.3 (April 2016) with a hypopnea requiring 4% desaturations.  The channels recorded and monitored were frontal, central and occipital EEG, electrooculogram (EOG), submentalis EMG (chin), nasal and oral airflow, thoracic and abdominal wall motion, anterior tibialis EMG, snore microphone, electrocardiogram, and pulse oximetry.  MEDICATIONS None Medications self-administered by patient taken the night of the study : N/A  SLEEP ARCHITECTURE The study was initiated at 10:04:54 PM and ended at 4:55:45 AM.  Sleep onset time was 8.9 minutes and the sleep efficiency was 90.8%%. The total sleep time was 373 minutes.  Stage REM latency was 8.0 minutes.  The patient spent 9.8%% of the night in stage N1 sleep, 72.3%% in stage N2 sleep, 0.0%% in stage N3 and 18% in REM.  Alpha intrusion was absent.  Supine sleep was 56.84%.  RESPIRATORY PARAMETERS The overall apnea/hypopnea index (AHI) was 12.7 per hour.  The respiratory disturbance index (RDI) was 21.7/h. There were 13 total apneas, including 9 obstructive, 4 central and 0 mixed apneas. There were 66 hypopneas and 56 RERAs.  The AHI during Stage REM sleep was 26.9 per hour.  AHI while supine was 18.7 per hour.  The mean oxygen saturation was 94.7%. The minimum SpO2 during sleep was 91.0%.  Soft snoring was noted during this study.  CARDIAC DATA The 2 lead EKG demonstrated  sinus rhythm. The mean heart rate was 71.2 beats per minute. Other EKG findings include: None.  LEG MOVEMENT DATA The total PLMS were 0 with a resulting PLMS index of 0.0. Associated arousal with leg movement index was 0.0 .  IMPRESSIONS - Mild obstructive sleep apnea overall (AHI 12.7/h); however, events were moderate with supine sleep (AHI 18.7/h) and worse during REM sleep (AHI 26.9/h) - No significant central sleep apnea occurred during this study (CAI = 0.6/h). - The patient had no oxygen desaturation during the study (Min O2 = 91.0%) - The patient snored with soft snoring volume. - No cardiac abnormalities were noted during this study. - Clinically significant periodic limb movements did not occur during sleep. No significant associated arousals.  DIAGNOSIS - Obstructive Sleep Apnea (327.23 [G47.33 ICD-10]) - Nocturnal Hypoxemia (327.26 [G47.36 ICD-10])  RECOMMENDATIONS - Therapeutic CPAP titration to determine optimal pressure required to alleviate sleep disordered breathing. - Effort should be made to optimize nasal and oropharyngeal patency. - Positional therapy avoiding supine position during sleep. - Avoid alcohol, sedatives and other CNS depressants that may worsen sleep apnea and disrupt normal sleep architecture. - Sleep hygiene should be reviewed to assess factors that may improve sleep quality. - Weight management and regular exercise should be initiated or continued if appropriate.  [Electronically signed] 03/25/2020 06:39 PM  Nicki Guadalajara MD, [facility], ABSM Diplomate, American Board of Sleep Medicine   NPI: [id number] [facility] PH: [phone]   FX: [phone] ACCREDITED BY THE AMERICAN ACADEMY OF SLEEP MEDICINE

## 2020-03-26 ENCOUNTER — Telehealth: Payer: Self-pay | Admitting: *Deleted

## 2020-03-26 ENCOUNTER — Other Ambulatory Visit: Payer: Self-pay | Admitting: Cardiovascular Disease

## 2020-03-26 DIAGNOSIS — G4733 Obstructive sleep apnea (adult) (pediatric): Secondary | ICD-10-CM

## 2020-03-26 NOTE — Telephone Encounter (Signed)
Left message that I will call again to discuss sleep study results and recommendations.

## 2020-03-28 ENCOUNTER — Telehealth: Payer: Self-pay | Admitting: Cardiovascular Disease

## 2020-03-28 NOTE — Telephone Encounter (Signed)
   Pt's wife calling, pt went for a sleep study on 03/20/20 and they would like to know what's there next step.  Please advise

## 2020-03-28 NOTE — Telephone Encounter (Signed)
     I was looking in pt's chart to find out who called him or who he was talking to about his Sleep Study and what his next step is.

## 2020-03-29 ENCOUNTER — Emergency Department (HOSPITAL_COMMUNITY)
Admission: EM | Admit: 2020-03-29 | Discharge: 2020-03-29 | Disposition: A | Discharge status: Home / Self Care | Payer: BC Managed Care – PPO | Attending: Emergency Medicine | Admitting: Emergency Medicine

## 2020-03-29 ENCOUNTER — Other Ambulatory Visit: Payer: Self-pay

## 2020-03-29 ENCOUNTER — Encounter (HOSPITAL_COMMUNITY): Payer: Self-pay | Admitting: Emergency Medicine

## 2020-03-29 DIAGNOSIS — J45909 Unspecified asthma, uncomplicated: Secondary | ICD-10-CM | POA: Diagnosis not present

## 2020-03-29 DIAGNOSIS — Z711 Person with feared health complaint in whom no diagnosis is made: Secondary | ICD-10-CM

## 2020-03-29 DIAGNOSIS — F17210 Nicotine dependence, cigarettes, uncomplicated: Secondary | ICD-10-CM | POA: Diagnosis not present

## 2020-03-29 DIAGNOSIS — Z202 Contact with and (suspected) exposure to infections with a predominantly sexual mode of transmission: Secondary | ICD-10-CM | POA: Insufficient documentation

## 2020-03-29 NOTE — ED Triage Notes (Signed)
Pt reports his partner cheated on him and got tested for chlamydia, which she reported she was negative for, however, he would like to be tested as a precaution. Denies any symptoms currently.

## 2020-03-29 NOTE — Discharge Instructions (Addendum)
Please return for any problem.  Follow-up with your regular care provider as instructed.  Contact the [facility] department as instructed for further STD screening.  Results from today's testing should be available through the MyChart system.

## 2020-03-29 NOTE — ED Provider Notes (Signed)
[place] EMERGENCY DEPARTMENT Provider Note   CSN: [id number] Arrival date & time: 03/29/20  0800     History Chief Complaint  Patient presents with  . Exposure to STD    Mason Zuniga is a 49 y.o. male.  49 year old male with prior medical history as detailed below presents for evaluation.  Patient reports marital infidelity.  Patient reports that his last contact with his wife was 1 week ago.  He reports that she was tested for gonorrhea chlamydia.  He reports that her tests are negative.  He seeks testing today for gonorrhea chlamydia.  He denies any symptoms.  He does not think that he will be positive.  He is aware that gonorrhea and Chlamydia are only 2 of the multiple possible STDs that he could have acquired.  He declines blood draw.  He does understand the need for additional STD screening for diseases such as HIV and syphilis.  Patient declined prophylactic treatment given his lack of symptoms and is believe that he will be negative on testing today.  The history is provided by the patient and medical records.  Exposure to STD This is a new problem. The current episode started more than 2 days ago. The problem has not changed since onset.Nothing aggravates the symptoms. Nothing relieves the symptoms.       Past Medical History:  Diagnosis Date  . Asthma   . Depression   . Witnessed apneic spells 03/20/2020    Patient Active Problem List   Diagnosis Date Noted  . Witnessed apneic spells 03/20/2020    Past Surgical History:  Procedure Laterality Date  . HERNIA REPAIR         Family History  Problem Relation Age of Onset  . Hypertension Mother     Social History   Tobacco Use  . Smoking status: Current Every Day Smoker    Packs/day: 1.00    Types: Cigarettes  . Smokeless tobacco: Never Used  Substance Use Topics  . Alcohol use: Yes    Alcohol/week: 3.0 standard drinks    Types: 3 Cans of beer per week    Comment: occasionally  .  Drug use: Yes    Types: Marijuana, Cocaine    Home Medications Prior to Admission medications   Not on File    Allergies    Zoloft [sertraline]  Review of Systems   Review of Systems  All other systems reviewed and are negative.   Physical Exam Updated Vital Signs BP (!) 165/72 (BP Location: Left Arm)   Pulse 65   Temp 97.9 F (36.6 C) (Oral)   Resp 16   Ht 5\' 11"  (1.803 m)   Wt 83.9 kg   SpO2 100%   BMI 25.80 kg/m   Physical Exam Vitals and nursing note reviewed.  Constitutional:      General: He is not in acute distress.    Appearance: Normal appearance. He is well-developed.  HENT:     Head: Normocephalic and atraumatic.  Eyes:     Conjunctiva/sclera: Conjunctivae normal.     Pupils: Pupils are equal, round, and reactive to light.  Cardiovascular:     Rate and Rhythm: Normal rate and regular rhythm.     Heart sounds: Normal heart sounds.  Pulmonary:     Effort: Pulmonary effort is normal. No respiratory distress.     Breath sounds: Normal breath sounds.  Abdominal:     General: There is no distension.     Palpations: Abdomen is soft.  Tenderness: There is no abdominal tenderness.  Genitourinary:    Penis: Normal.      Comments: No penile drainage  No groin lymphadenopathy  No penile lesions Musculoskeletal:        General: No deformity. Normal range of motion.     Cervical back: Normal range of motion and neck supple.  Skin:    General: Skin is warm and dry.  Neurological:     Mental Status: He is alert and oriented to person, place, and time.     ED Results / Procedures / Treatments   Labs (all labs ordered are listed, but only abnormal results are displayed) Labs Reviewed  GC/CHLAMYDIA PROBE AMP ([place]) NOT AT [facility]    EKG None  Radiology No results found.  Procedures Procedures (including critical care time)  Medications Ordered in ED Medications - No data to display  ED Course  I have reviewed the triage vital signs  and the nursing notes.  Pertinent labs & imaging results that were available during my care of the patient were reviewed by me and considered in my medical decision making (see chart for details).    MDM Rules/Calculators/A&P                      MDM  Complete  Mason Zuniga was evaluated in Emergency Department on 03/29/2020 for the symptoms described in the history of present illness. He was evaluated in the context of the global COVID-19 pandemic, which necessitated consideration that the patient might be at risk for infection with the SARS-CoV-2 virus that causes COVID-19. Institutional protocols and algorithms that pertain to the evaluation of patients at risk for COVID-19 are in a state of rapid change based on information released by regulatory bodies including the CDC and federal and state organizations. These policies and algorithms were followed during the patient's care in the ED.   Patient is presenting for STD screen.  He is specifically requesting GC and Chlamydia testing.  He is without symptoms.  He declined blood draw.  Patient is swabbed today for GC and chlamydia.  He does understand the need for close follow-up with the health department for further STD screening and evaluation.  Strict return precautions given and understood.  Importance of close follow-up repeatedly stressed. Final Clinical Impression(s) / ED Diagnoses Final diagnoses:  Concern about STD in male without diagnosis    Rx / DC Orders ED Discharge Orders    None       Wynetta Fines, MD 03/29/20 [id number]

## 2020-04-01 LAB — GC/CHLAMYDIA PROBE AMP (~~LOC~~) NOT AT ARMC
Chlamydia: NEGATIVE
Comment: NEGATIVE
Comment: NORMAL
Neisseria Gonorrhea: NEGATIVE

## 2020-04-02 DIAGNOSIS — Z202 Contact with and (suspected) exposure to infections with a predominantly sexual mode of transmission: Secondary | ICD-10-CM | POA: Diagnosis not present

## 2020-06-02 NOTE — Progress Notes (Deleted)
Cardiology Office Note:    Date:  06/02/2020   ID:  Mason Zuniga, DOB 11-Jan-1971, MRN [id number]  PCP:  Patient, No Pcp Per  Cardiologist:  No primary care provider on file.  Electrophysiologist:  None   Referring MD: No ref. provider found   No chief complaint on file.   History of Present Illness:    Mason Zuniga is a 49 y.o. male with a hx of asthma, depression, hypertension who presents for follow-up.  He was initially seen on 02/29/2020 for an ED follow-up for syncope.  He was at his daughter's birthday party and reports that he drank a few shots and used marijuana for the first time in years.  Stood up to walk outside to smoke a cigarette and passed out.  Family witnessed him passing out.  Reports that he was out for a few seconds.  Came to you and tried to stand up and then passed out again.  Both episodes occurred with position change.  In the ED, he was asymptomatic.  Orthostatics were negative, but he had received IV fluids from EMS.  Troponin was undetectable.  Labs unremarkable.  He denies any chest pain, dyspnea, or palpitations preceding the episode.  He reports he is usually very active, will play basketball on occasion.  Denies any exertional chest pain.  Does report he gets dyspneic with playing basketball, will use his inhaler and improves.  Denies any palpitations.  He had 1 prior syncopal episode, 20 years ago at a club.  Reports that he had been smoking marijuana that night as well.  He was in prison for 20 years and was released last year.  He reports that his wife notes that he stops breathing at night.  Smokes 1ppd, stopped for 20 years while in prison, started smoking again last year.  Father died of MI in 12s.    TTE on 04/13/20 showed LVEF 60-65%, normal RV function, no significant valvular disease.  Cardiac monitor on 03/27/2020 showed no significant arrhythmias.  Past Medical History:  Diagnosis Date  . Asthma   . Depression   . Witnessed apneic spells 03/20/2020     Past Surgical History:  Procedure Laterality Date  . HERNIA REPAIR      Current Medications: No outpatient medications have been marked as taking for the 06/03/20 encounter (Appointment) with Donato Heinz, MD.     Allergies:   Zoloft [sertraline]   Social History   Socioeconomic History  . Marital status: Single    Spouse name: Not on file  . Number of children: Not on file  . Years of education: Not on file  . Highest education level: Not on file  Occupational History  . Not on file  Tobacco Use  . Smoking status: Current Every Day Smoker    Packs/day: 1.00    Types: Cigarettes  . Smokeless tobacco: Never Used  Substance and Sexual Activity  . Alcohol use: Yes    Alcohol/week: 3.0 standard drinks    Types: 3 Cans of beer per week    Comment: occasionally  . Drug use: Yes    Types: Marijuana, Cocaine  . Sexual activity: Not on file  Other Topics Concern  . Not on file  Social History Narrative  . Not on file   Social Determinants of Health   Financial Resource Strain:   . Difficulty of Paying Living Expenses:   Food Insecurity:   . Worried About Charity fundraiser in the Last Year:   .  Ran Out of Food in the Last Year:   Transportation Needs:   . Freight forwarder (Medical):   Marland Kitchen Lack of Transportation (Non-Medical):   Physical Activity:   . Days of Exercise per Week:   . Minutes of Exercise per Session:   Stress:   . Feeling of Stress :   Social Connections:   . Frequency of Communication with Friends and Family:   . Frequency of Social Gatherings with Friends and Family:   . Attends Religious Services:   . Active Member of Clubs or Organizations:   . Attends Banker Meetings:   Marland Kitchen Marital Status:      Family History: The patient's family history includes Hypertension in his mother.  ROS:   Please see the history of present illness.     All other systems reviewed and are negative.  EKGs/Labs/Other Studies Reviewed:     The following studies were reviewed today:   EKG:  EKG is  ordered today.  The ekg ordered today demonstrates normal sinus rhythm, rate 63, LVH, Q waves in I, aVL, V1/2  Recent Labs: 02/24/2020: BUN 11; Creatinine, Ser 1.08; Hemoglobin 14.0; Platelets 155; Potassium 3.6; Sodium 139  Recent Lipid Panel No results found for: CHOL, TRIG, HDL, CHOLHDL, VLDL, LDLCALC, LDLDIRECT  Physical Exam:    VS:  There were no vitals taken for this visit.    Wt Readings from Last 3 Encounters:  03/29/20 185 lb (83.9 kg)  03/20/20 185 lb (83.9 kg)  02/29/20 183 lb (83 kg)     GEN:  Well nourished, well developed in no acute distress HEENT: Normal NECK: No JVD; No carotid bruits LYMPHATICS: No lymphadenopathy CARDIAC: RRR, no murmurs, rubs, gallops RESPIRATORY:  Clear to auscultation without rales, wheezing or rhonchi  ABDOMEN: Soft, non-tender, non-distended MUSCULOSKELETAL:  No edema; No deformity  SKIN: Warm and dry NEUROLOGIC:  Alert and oriented x 3 PSYCHIATRIC:  Normal affect   ASSESSMENT:    No diagnosis found. PLAN:     Syncope: Suspect orthostasis, likely dehydrated in setting of alcohol/marijuana use.  Zio patch x2 which showed no significant arrhythmias.  TTE shows no significant abnormalities.  Hypertension: Reports history of hypertension but has not been on any medications recently.  Normal BP in clinic today.  Mild LVH on TTE.  EKG abnormalities: Q waves in I, aVL, V1/2.  No wall motion abnormalities on TTE  Suspected OSA: Wife reports that he seems to stop breathing while he sleeps.  Will check sleep study  Tobacco use smokes 1 pack/day.  Encourage cessation  RTC in 3 months  Medication Adjustments/Labs and Tests Ordered: Current medicines are reviewed at length with the patient today.  Concerns regarding medicines are outlined above.  No orders of the defined types were placed in this encounter.  No orders of the defined types were placed in this  encounter.   There are no Patient Instructions on file for this visit.   Signed, Little Ishikawa, MD  06/02/2020 5:02 PM    [facility]

## 2020-06-03 ENCOUNTER — Telehealth: Payer: Self-pay | Admitting: *Deleted

## 2020-06-03 ENCOUNTER — Ambulatory Visit: Payer: BC Managed Care – PPO | Admitting: Cardiology

## 2020-06-03 NOTE — Telephone Encounter (Signed)
Informed patient of sleep study results and patient understanding was verbalized. Patient understands his sleep study showed Mild obstructive sleep apnea overall (AHI 12.7/h); however, events were moderate with supine sleep (AHI 18.7/h) and worse during REM sleep (AHI 26.9/h)  Patient understands his: -DIAGNOSIS - Obstructive Sleep Apnea (327.23 [G47.33 ICD-10]) - Nocturnal Hypoxemia (327.26 [G47.36 ICD-10])  Patient understands Dr Tresa Endo  RECOMMENDATIONS - Therapeutic CPAP titration to determine optimal pressure required to alleviate sleep disordered breathing  Titration sent to sleep pool

## 2020-06-03 NOTE — Telephone Encounter (Signed)
-----   Message from Lennette Bihari, MD sent at 03/25/2020  6:44 PM EDT ----- Burna Mortimer, please notify pt and schedule for CPAP titration.

## 2020-06-14 ENCOUNTER — Telehealth: Payer: Self-pay | Admitting: *Deleted

## 2020-06-14 NOTE — Telephone Encounter (Signed)
-----   Message from Reesa Chew, CMA sent at 06/03/2020  3:28 PM EDT ----- Regarding: precert CPAP titration

## 2020-08-13 ENCOUNTER — Telehealth: Payer: Self-pay | Admitting: *Deleted

## 2020-08-13 NOTE — Telephone Encounter (Signed)
Left CPAP titration appointment details on VM.

## 2020-09-18 ENCOUNTER — Other Ambulatory Visit: Payer: Self-pay

## 2020-09-18 ENCOUNTER — Ambulatory Visit (HOSPITAL_BASED_OUTPATIENT_CLINIC_OR_DEPARTMENT_OTHER): Payer: BC Managed Care – PPO | Attending: Cardiovascular Disease | Admitting: Cardiovascular Disease

## 2020-09-18 DIAGNOSIS — R0683 Snoring: Secondary | ICD-10-CM

## 2020-09-18 DIAGNOSIS — G4733 Obstructive sleep apnea (adult) (pediatric): Secondary | ICD-10-CM

## 2020-09-18 DIAGNOSIS — R0681 Apnea, not elsewhere classified: Secondary | ICD-10-CM | POA: Diagnosis not present

## 2020-10-13 ENCOUNTER — Encounter (HOSPITAL_BASED_OUTPATIENT_CLINIC_OR_DEPARTMENT_OTHER): Payer: Self-pay | Admitting: Cardiovascular Disease

## 2020-10-13 NOTE — Procedures (Signed)
Patient Name: Mason Zuniga, Mason Zuniga Date: 09/18/2020 Gender: Male D.O.B: 03/08/1971 Age (years): 48 Referring Provider: Epifanio Lesches Height (inches): 71 Interpreting Physician: Nicki Guadalajara MD, ABSM Weight (lbs): 185 RPSGT: Shelah Lewandowsky BMI: 26 MRN: [id number] Neck Size: 14.00  CLINICAL INFORMATION The patient is referred for a CPAP titration to treat sleep apnea.  Date of NPSG: 03/20/2020: AHI 12.7/h; RDI 21.7/h; REM AHI 26.9/h: supine sleep AHI 18.7/h.   SLEEP STUDY TECHNIQUE As per the AASM Manual for the Scoring of Sleep and Associated Events v2.3 (April 2016) with a hypopnea requiring 4% desaturations.  The channels recorded and monitored were frontal, central and occipital EEG, electrooculogram (EOG), submentalis EMG (chin), nasal and oral airflow, thoracic and abdominal wall motion, anterior tibialis EMG, snore microphone, electrocardiogram, and pulse oximetry. Continuous positive airway pressure (CPAP) was initiated at the beginning of the study and titrated to treat sleep-disordered breathing.  MEDICATIONS None Medications self-administered by patient taken the night of the study : N/A  TECHNICIAN COMMENTS Comments added by technician: NONE Comments added by scorer: N/A  RESPIRATORY PARAMETERS Optimal PAP Pressure (cm): 16 AHI at Optimal Pressure (/hr): 0.0 Overall Minimal O2 (%): 90.0 Supine % at Optimal Pressure (%): 0 Minimal O2 at Optimal Pressure (%): 95.0   SLEEP ARCHITECTURE The study was initiated at 9:57:54 PM and ended at 5:01:10 AM.  Sleep onset time was 2.9 minutes and the sleep efficiency was 92.3%%. The total sleep time was 390.5 minutes.  The patient spent 7.3%% of the night in stage N1 sleep, 64.0%% in stage N2 sleep, 0.0%% in stage N3 and 28.7% in REM.Stage REM latency was 17.5 minutes  Wake after sleep onset was 29.8. Alpha intrusion was absent. Supine sleep was 46.73%.  CARDIAC DATA The 2 lead EKG demonstrated sinus rhythm.  The mean heart rate was 64.3 beats per minute. Other EKG findings include: None.  LEG MOVEMENT DATA The total Periodic Limb Movements of Sleep (PLMS) were 0. The PLMS index was 0.0. A PLMS index of <15 is considered normal in adults.  IMPRESSIONS - CPAP was initiated at 5 cm and was titrated to optimal PAP pressure at 16 cm of water. - Central sleep apnea was not noted during this titration (CAI = 0.5/h). - Significant oxygen desaturations were not observed during this titration (min O2 = 90.0%). - The patient snored with moderate snoring volume during this titration study; snoring resolved at 16 cm. - No cardiac abnormalities were observed during this study. - Clinically significant periodic limb movements were not noted during this study. Arousals associated with PLMs were rare.  DIAGNOSIS - Obstructive Sleep Apnea (G47.33)  RECOMMENDATIONS - Recommend an initial trial of CPAP therapy with EPR at 16 cm H2O with heated humidification.  A Medium Wide size Philips Respironics Full Face Mask Dreamwear mask was used for the titration. - Effort should be made to optimize nasal and oropharyngeal patency. - Avoid alcohol, sedatives and other CNS depressants that may worsen sleep apnea and disrupt normal sleep architecture. - Sleep hygiene should be reviewed to assess factors that may improve sleep quality. - Weight management and regular exercise should be initiated or continued. - Recommend a download in 30 days and sleep clinic evaluation after 4 weeks of therapy.   [Electronically signed] 10/13/2020 09:23 PM  Nicki Guadalajara MD, [facility], ABSM Diplomate, American Board of Sleep Medicine   NPI: [id number] [facility] PH: [phone]   FX: [phone] ACCREDITED BY THE AMERICAN ACADEMY OF SLEEP MEDICINE

## 2020-10-17 ENCOUNTER — Telehealth: Payer: Self-pay | Admitting: *Deleted

## 2020-10-17 NOTE — Telephone Encounter (Signed)
Informed patient of sleep study results and patient understanding was verbalized. Patient understands his sleep study showed :  IMPRESSIONS - CPAP was initiated at 5 cm and was titrated to optimal PAP pressure at 16 cm of water. - Significant oxygen desaturations were not observed during this titration (min O2 = 90.0%). - The patient snored with moderate snoring volume during this titration study; snoring resolved at 16 cm. - No cardiac abnormalities were observed during this study.  DIAGNOSIS - Obstructive Sleep Apnea (G47.33)  RECOMMENDATIONS - Recommend an initial trial of CPAP therapy with EPR at 16 cm H2O with heated humidification.  A Medium Wide size Philips Respironics Full Face Mask Dreamwear mask was used for the titration.  Upon patient request DME selection is [facility]. Patient understands she/he will be contacted by [facility] to set up her/he cpap. Patient understands to call if [facility] does not contact her/he with new setup in a timely manner. Patient understands they will be called once confirmation has been received from [facility] that they have received their new machine to schedule 10 week follow up appointment.   [facility] notified of new cpap order  Please add to airview Patient was grateful for the call and thanked me.

## 2020-10-17 NOTE — Telephone Encounter (Signed)
-----   Message from Lennette Bihari, MD sent at 10/13/2020  9:29 PM EDT ----- Cor[place]e [place], please notify pt and set up with DME for CPAP initiation

## 2021-06-03 IMAGING — DX DG CHEST 1V PORT
1 series · 1 of 1 positions shown · non-contrast
Comparison: None.

CLINICAL DATA: Near syncope, lethargy

EXAM:
PORTABLE CHEST 1 VIEW

[chest ap]
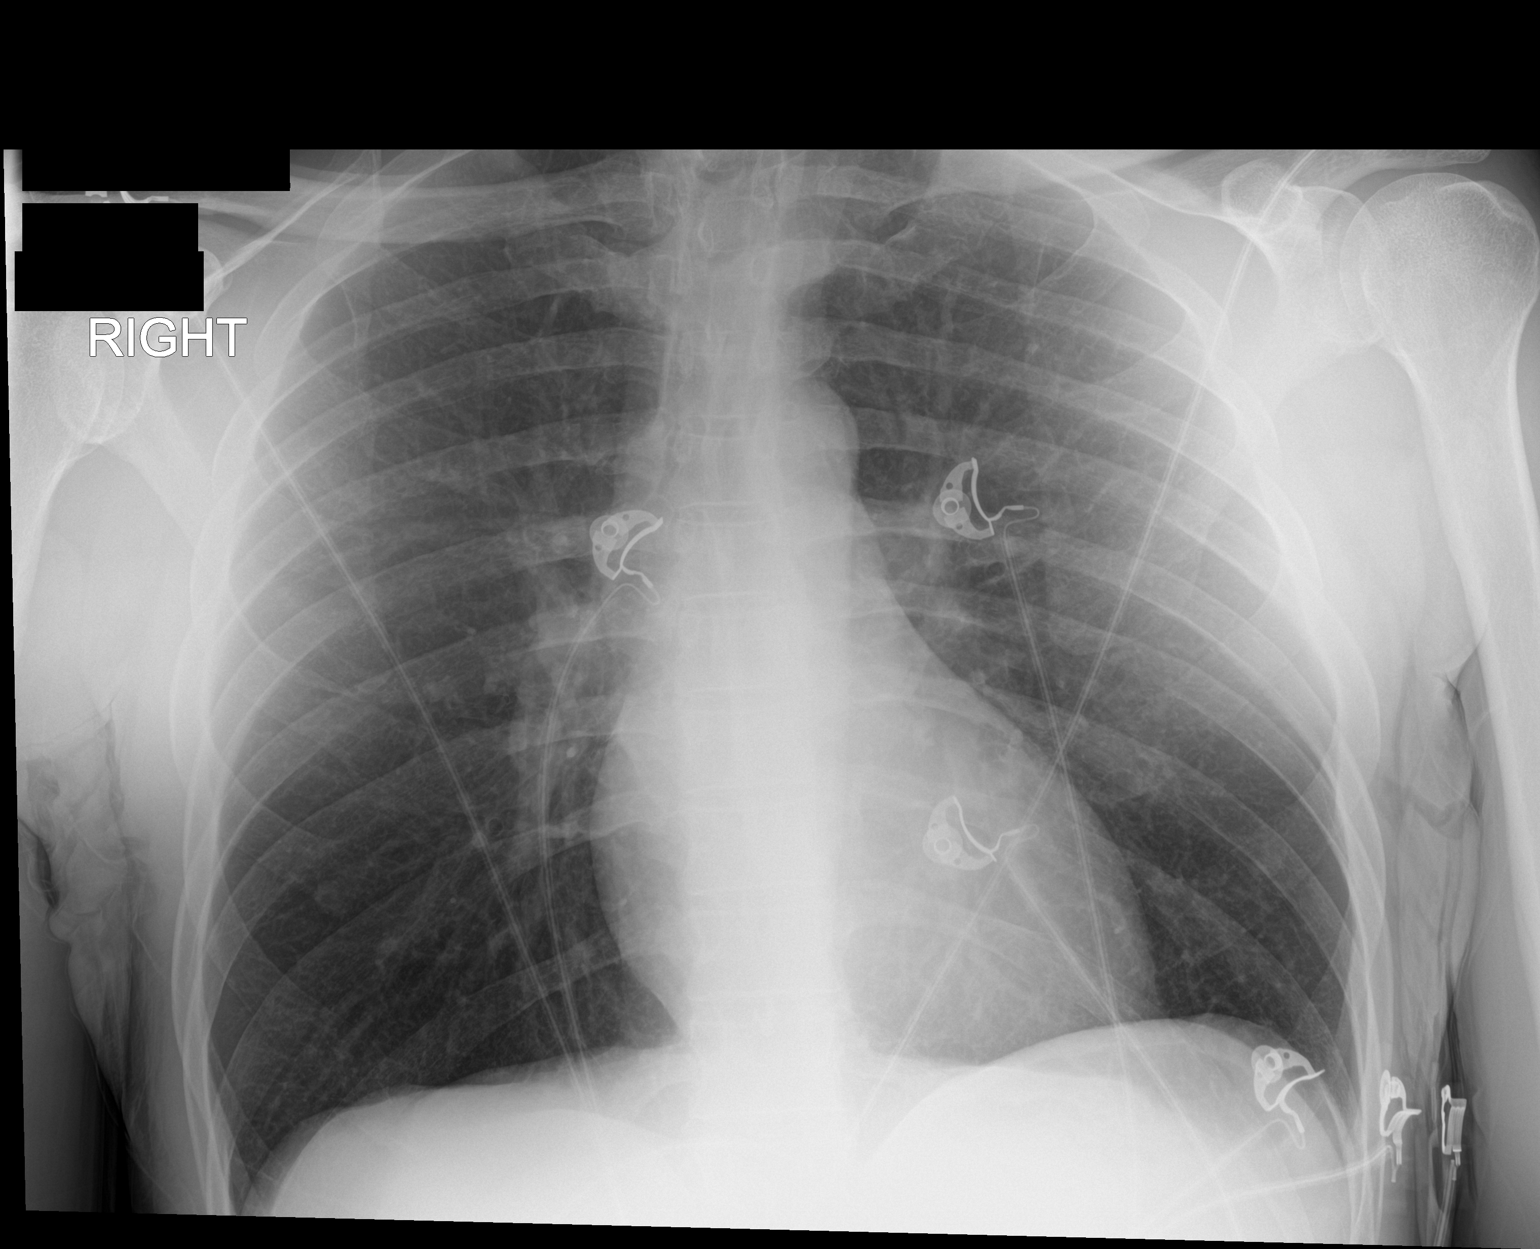

[1 of 1 positions shown; findings below may reference images not displayed]

FINDINGS: The heart size and mediastinal contours are within normal limits.
Both lungs are clear. The visualized skeletal structures are
unremarkable.
IMPRESSION: No active disease.

## 2022-02-02 ENCOUNTER — Emergency Department (INDEPENDENT_AMBULATORY_CARE_PROVIDER_SITE_OTHER): Payer: Self-pay

## 2022-02-02 ENCOUNTER — Emergency Department (HOSPITAL_COMMUNITY): Payer: Self-pay

## 2022-02-02 ENCOUNTER — Encounter (HOSPITAL_COMMUNITY): Payer: Self-pay

## 2022-02-02 ENCOUNTER — Other Ambulatory Visit: Payer: Self-pay

## 2022-02-02 ENCOUNTER — Emergency Department (INDEPENDENT_AMBULATORY_CARE_PROVIDER_SITE_OTHER)
Admission: EM | Admit: 2022-02-02 | Discharge: 2022-02-02 | Disposition: A | Discharge status: Home / Self Care | Payer: Self-pay | Source: Home / Self Care

## 2022-02-02 ENCOUNTER — Emergency Department (HOSPITAL_COMMUNITY)
Admission: EM | Admit: 2022-02-02 | Discharge: 2022-02-02 | Disposition: A | Discharge status: Home / Self Care | Payer: Self-pay | Attending: Emergency Medicine | Admitting: Emergency Medicine

## 2022-02-02 DIAGNOSIS — R0602 Shortness of breath: Secondary | ICD-10-CM | POA: Insufficient documentation

## 2022-02-02 DIAGNOSIS — D72829 Elevated white blood cell count, unspecified: Secondary | ICD-10-CM | POA: Insufficient documentation

## 2022-02-02 DIAGNOSIS — J441 Chronic obstructive pulmonary disease with (acute) exacerbation: Secondary | ICD-10-CM | POA: Insufficient documentation

## 2022-02-02 DIAGNOSIS — R062 Wheezing: Secondary | ICD-10-CM | POA: Insufficient documentation

## 2022-02-02 DIAGNOSIS — F17210 Nicotine dependence, cigarettes, uncomplicated: Secondary | ICD-10-CM | POA: Insufficient documentation

## 2022-02-02 DIAGNOSIS — I10 Essential (primary) hypertension: Secondary | ICD-10-CM | POA: Insufficient documentation

## 2022-02-02 DIAGNOSIS — R0789 Other chest pain: Secondary | ICD-10-CM | POA: Insufficient documentation

## 2022-02-02 DIAGNOSIS — Z20822 Contact with and (suspected) exposure to covid-19: Secondary | ICD-10-CM | POA: Insufficient documentation

## 2022-02-02 DIAGNOSIS — Z7951 Long term (current) use of inhaled steroids: Secondary | ICD-10-CM | POA: Insufficient documentation

## 2022-02-02 DIAGNOSIS — R059 Cough, unspecified: Secondary | ICD-10-CM

## 2022-02-02 DIAGNOSIS — R718 Other abnormality of red blood cells: Secondary | ICD-10-CM | POA: Insufficient documentation

## 2022-02-02 DIAGNOSIS — R000 Tachycardia, unspecified: Secondary | ICD-10-CM | POA: Insufficient documentation

## 2022-02-02 DIAGNOSIS — J45909 Unspecified asthma, uncomplicated: Secondary | ICD-10-CM | POA: Insufficient documentation

## 2022-02-02 DIAGNOSIS — R0682 Tachypnea, not elsewhere classified: Secondary | ICD-10-CM | POA: Insufficient documentation

## 2022-02-02 HISTORY — DX: Essential (primary) hypertension: I10

## 2022-02-02 LAB — BASIC METABOLIC PANEL
Anion gap: 8 (ref 5–15)
BUN: 12 mg/dL (ref 6–20)
CO2: 27 mmol/L (ref 22–32)
Calcium: 9.3 mg/dL (ref 8.9–10.3)
Chloride: 100 mmol/L (ref 98–111)
Creatinine, Ser: 1 mg/dL (ref 0.61–1.24)
GFR, Estimated: >60 mL/min (ref >60)
Glucose, Bld: 134 mg/dL — ABNORMAL HIGH (ref 70–99)
Potassium: 4.4 mmol/L (ref 3.5–5.1)
Sodium: 135 mmol/L (ref 135–145)

## 2022-02-02 LAB — CBC
HCT: 50.1 % (ref 39.0–52.0)
Hemoglobin: 17.3 g/dL — ABNORMAL HIGH (ref 13.0–17.0)
MCH: 29.4 pg (ref 26.0–34.0)
MCHC: 34.5 g/dL (ref 30.0–36.0)
MCV: 85.1 fL (ref 80.0–100.0)
Platelets: 241 10*3/uL (ref 150–400)
RBC: 5.89 MIL/uL — ABNORMAL HIGH (ref 4.22–5.81)
RDW: 13 % (ref 11.5–15.5)
WBC: 14.1 10*3/uL — ABNORMAL HIGH (ref 4.0–10.5)
nRBC: 0 % (ref 0.0–0.2)

## 2022-02-02 LAB — RESP PANEL BY RT-PCR (FLU A&B, COVID) ARPGX2
Influenza A by PCR: NEGATIVE
Influenza B by PCR: NEGATIVE
SARS Coronavirus 2 by RT PCR: NEGATIVE

## 2022-02-02 LAB — HEPATIC FUNCTION PANEL
ALT: 13 U/L (ref 0–44)
AST: 19 U/L (ref 15–41)
Albumin: 4.3 g/dL (ref 3.5–5.0)
Alkaline Phosphatase: 97 U/L (ref 38–126)
Bilirubin, Direct: 0.1 mg/dL (ref 0.0–0.2)
Indirect Bilirubin: 0.4 mg/dL (ref 0.3–0.9)
Total Bilirubin: 0.5 mg/dL (ref 0.3–1.2)
Total Protein: 8.1 g/dL (ref 6.5–8.1)

## 2022-02-02 LAB — TROPONIN I (HIGH SENSITIVITY)
Troponin I (High Sensitivity): 2 ng/L (ref <18)
Troponin I (High Sensitivity): 2 ng/L (ref <18)

## 2022-02-02 LAB — POCT INFLUENZA A/B
Influenza A, POC: NEGATIVE
Influenza B, POC: NEGATIVE

## 2022-02-02 LAB — BRAIN NATRIURETIC PEPTIDE: B Natriuretic Peptide: 43.1 pg/mL (ref 0.0–100.0)

## 2022-02-02 LAB — POC SARS CORONAVIRUS 2 AG -  ED: SARS Coronavirus 2 Ag: NEGATIVE

## 2022-02-02 MED ORDER — ALBUTEROL SULFATE HFA 108 (90 BASE) MCG/ACT IN AERS
1.0000 | INHALATION_SPRAY | Freq: Four times a day (QID) | RESPIRATORY_TRACT | 0 refills | Status: DC | PRN
Start: 2022-02-02 — End: 2023-03-11

## 2022-02-02 MED ORDER — METHYLPREDNISOLONE SODIUM SUCC 125 MG IJ SOLR
125.0000 mg | Freq: Once | INTRAMUSCULAR | Status: AC
  Administered 2022-02-02: 125 mg via INTRAVENOUS
  Filled 2022-02-02: qty 2

## 2022-02-02 MED ORDER — IPRATROPIUM-ALBUTEROL 0.5-2.5 (3) MG/3ML IN SOLN
3.0000 mL | Freq: Once | RESPIRATORY_TRACT | Status: AC
  Administered 2022-02-02: 3 mL via RESPIRATORY_TRACT
  Filled 2022-02-02: qty 3

## 2022-02-02 MED ORDER — IOHEXOL 350 MG/ML SOLN
80.0000 mL | Freq: Once | INTRAVENOUS | Status: AC | PRN
  Administered 2022-02-02: 80 mL via INTRAVENOUS

## 2022-02-02 MED ORDER — BENZONATATE 200 MG PO CAPS: 200.0000 mg | ORAL_CAPSULE | Freq: Three times a day (TID) | ORAL | 0 refills | Status: AC | PRN

## 2022-02-02 MED ORDER — DOXYCYCLINE HYCLATE 100 MG PO TABS
100.0000 mg | ORAL_TABLET | Freq: Once | ORAL | Status: AC
  Administered 2022-02-02: 100 mg via ORAL
  Filled 2022-02-02: qty 1

## 2022-02-02 MED ORDER — DOXYCYCLINE HYCLATE 100 MG PO CAPS: 100.0000 mg | ORAL_CAPSULE | Freq: Two times a day (BID) | ORAL | 0 refills | Status: DC

## 2022-02-02 MED ORDER — PREDNISONE 20 MG PO TABS: ORAL_TABLET | ORAL | 0 refills | Status: DC

## 2022-02-02 MED ORDER — IPRATROPIUM-ALBUTEROL 0.5-2.5 (3) MG/3ML IN SOLN
3.0000 mL | Freq: Once | RESPIRATORY_TRACT | Status: AC
Start: 2022-02-02 — End: 2022-02-02
  Administered 2022-02-02: 3 mL via RESPIRATORY_TRACT
  Filled 2022-02-02: qty 3

## 2022-02-02 MED ORDER — MAGNESIUM SULFATE 2 GM/50ML IV SOLN
2.0000 g | Freq: Once | INTRAVENOUS | Status: AC
  Administered 2022-02-02: 2 g via INTRAVENOUS
  Filled 2022-02-02: qty 50

## 2022-02-02 MED ORDER — DOXYCYCLINE HYCLATE 100 MG PO CAPS: 100.0000 mg | ORAL_CAPSULE | Freq: Two times a day (BID) | ORAL | 0 refills | Status: AC

## 2022-02-02 NOTE — ED Provider Triage Note (Signed)
Emergency Medicine Provider Triage Evaluation Note  Mason Zuniga , a 51 y.o. male  was evaluated in triage.  Pt complains of chest pain, shortness of breath and abdominal pain.  Started 2 days ago, the pain is sharp.  Is worse when he breathes, feels the pain when he breathes in his chest as well as in his abdomen.  Does not radiate to the back.  History of asthma as a child, no current pulmonary disease.  He smokes cigarettes daily half a pack..  Review of Systems  Positive: Cp, sob, AP Negative: SYNCOPE  Physical Exam  BP (!) 167/98 (BP Location: Left Arm)    Pulse (!) 102    Temp 97.7 F (36.5 C) (Oral)    Resp 18    SpO2 94%  Gen:   Awake, no distress   Resp:  Rhonchi, bilateral lower extremity wheezes. MSK:   Moves extremities without difficulty  Other:  Tachycardic, regular rhythm  Medical Decision Making  Medically screening exam initiated at 5:38 PM.  Appropriate orders placed.  Mason Zuniga was informed that the remainder of the evaluation will be completed by another provider, this initial triage assessment does not replace that evaluation, and the importance of remaining in the ED until their evaluation is complete.     Theron Arista, PA-C 02/02/22 1739

## 2022-02-02 NOTE — ED Triage Notes (Signed)
Patient c/o  constant mid chest pain and SOB x 2 days

## 2022-02-02 NOTE — ED Provider Notes (Signed)
[facility]-EMERGENCY DEPT Provider Note   CSN: [id number] Arrival date & time: 02/02/22  1725     History  Chief Complaint  Patient presents with   Chest Pain   Shortness of Breath    Mason Zuniga is a 51 y.o. male.   Chest Pain Associated symptoms: shortness of breath   Shortness of Breath Associated symptoms: chest pain    Patient with medical history notable for witnessed apneic spells, asthma, hypertension presents due to shortness of breath and chest pain.  Started 2 days ago, has been progressively worsening.  The pain is sharp, pleuritic.  He smokes half a pack of cigarettes daily.  No history of MI or PE, no recent travel or surgeries.  He denies any history of COPD, was seen earlier at urgent care and told his x-ray showed findings consistent with COPD and was given doxycycline and prednisone for presumed atypical infection.  Patient's shortness of breath worsened prompting him to come to the ED.   Home Medications Prior to Admission medications   Medication Sig Start Date End Date Taking? Authorizing Provider  albuterol (VENTOLIN HFA) 108 (90 Base) MCG/ACT inhaler Inhale 1-2 puffs into the lungs every 6 (six) hours as needed for wheezing or shortness of breath. 02/02/22  Yes Theron Arista, PA-C  benzonatate (TESSALON) 200 MG capsule Take 1 capsule (200 mg total) by mouth 3 (three) times daily as needed for up to 7 days for cough. 02/02/22 02/09/22  Trevor Iha, FNP  doxycycline (VIBRAMYCIN) 100 MG capsule Take 1 capsule (100 mg total) by mouth 2 (two) times daily for 10 days. 02/02/22 02/12/22  Theron Arista, PA-C  Phenylephrine-Pheniramine-DM [facility] COLD & COUGH PO) Take by mouth.    [provider]  predniSONE (DELTASONE) 20 MG tablet Take 3 tabs PO daily x 3 days, then 2 tabs PO daily x 3 days, then 1 tab PO daily x 3 days 02/02/22   Theron Arista, PA-C      Allergies    Zoloft [sertraline]    Review of Systems   Review of Systems   Respiratory:  Positive for shortness of breath.   Cardiovascular:  Positive for chest pain.   Physical Exam Updated Vital Signs BP 128/78    Pulse 92    Temp 98.3 F (36.8 C)    Resp (!) 40    SpO2 97%  Physical Exam Vitals and nursing note reviewed. Exam conducted with a chaperone present.  Constitutional:      Appearance: Normal appearance. He is ill-appearing.  HENT:     Head: Normocephalic and atraumatic.  Eyes:     General: No scleral icterus.       Right eye: No discharge.        Left eye: No discharge.     Extraocular Movements: Extraocular movements intact.     Pupils: Pupils are equal, round, and reactive to light.  Cardiovascular:     Rate and Rhythm: Regular rhythm. Tachycardia present.     Pulses: Normal pulses.     Heart sounds: Normal heart sounds. No murmur heard.   No friction rub. No gallop.  Pulmonary:     Effort: No respiratory distress.     Breath sounds: Wheezing and rhonchi present.     Comments: Tachypneic, wheezes and rhonchi diffusely throughout the lung fields Abdominal:     General: Abdomen is flat. Bowel sounds are normal. There is no distension.     Palpations: Abdomen is soft.     Tenderness:  There is no abdominal tenderness.  Skin:    General: Skin is warm and dry.     Coloration: Skin is not jaundiced.  Neurological:     Mental Status: He is alert. Mental status is at baseline.     Coordination: Coordination normal.    ED Results / Procedures / Treatments   Labs (all labs ordered are listed, but only abnormal results are displayed) Labs Reviewed  BASIC METABOLIC PANEL - Abnormal; Notable for the following components:      Result Value   Glucose, Bld 134 (*)    All other components within normal limits  CBC - Abnormal; Notable for the following components:   WBC 14.1 (*)    RBC 5.89 (*)    Hemoglobin 17.3 (*)    All other components within normal limits  RESP PANEL BY RT-PCR (FLU A&B, COVID) ARPGX2  HEPATIC FUNCTION PANEL  BRAIN  NATRIURETIC PEPTIDE  URINALYSIS, ROUTINE W REFLEX MICROSCOPIC  RAPID URINE DRUG SCREEN, HOSP PERFORMED  TROPONIN I (HIGH SENSITIVITY)  TROPONIN I (HIGH SENSITIVITY)    EKG EKG Interpretation  Date/Time:  Monday February 02 2022 17:38:02 EST Ventricular Rate:  100 PR Interval:  150 QRS Duration: 78 QT Interval:  325 QTC Calculation: 420 R Axis:   82 Text Interpretation: Sinus tachycardia Biatrial enlargement Anterior infarct, old No STEMI Confirmed by Ernie Avena ([id number]) on 02/02/2022 5:42:17 PM  Radiology DG Chest 2 View  Result Date: 02/02/2022 CLINICAL DATA:  Shortness of breath.  Cough. EXAM: CHEST - 2 VIEW COMPARISON:  02/24/2020 FINDINGS: The heart size and mediastinal contours are within normal limits. Pulmonary hyperinflation and central peribronchial thickening are noted, consistent with COPD. No evidence of pulmonary infiltrate or edema. No evidence of pleural effusion. Cervical spine fusion hardware again noted. IMPRESSION: COPD. No active cardiopulmonary disease. Electronically Signed   By: Danae Orleans M.D.   On: 02/02/2022 08:49   CT Angio Chest PE W/Cm &/Or Wo Cm  Result Date: 02/02/2022 CLINICAL DATA:  Mid chest pain, shortness of breath EXAM: CT ANGIOGRAPHY CHEST WITH CONTRAST TECHNIQUE: Multidetector CT imaging of the chest was performed using the standard protocol during bolus administration of intravenous contrast. Multiplanar CT image reconstructions and MIPs were obtained to evaluate the vascular anatomy. RADIATION DOSE REDUCTION: This exam was performed according to the departmental dose-optimization program which includes automated exposure control, adjustment of the mA and/or kV according to patient size and/or use of iterative reconstruction technique. CONTRAST:  49mL OMNIPAQUE IOHEXOL 350 MG/ML SOLN COMPARISON:  None. FINDINGS: Cardiovascular: No filling defects in the pulmonary arteries to suggest pulmonary emboli. Heart is normal size. Aorta is normal caliber.  Mediastinum/Nodes: No mediastinal, hilar, or axillary adenopathy. Trachea and esophagus are unremarkable. Thyroid unremarkable. Lungs/Pleura: Peribronchial thickening. No confluent airspace opacities or effusions. Upper Abdomen: Imaging into the upper abdomen demonstrates no acute findings. Musculoskeletal: Chest wall soft tissues are unremarkable. No acute bony abnormality. Review of the MIP images confirms the above findings. IMPRESSION: No evidence of pulmonary embolus. Peribronchial thickening compatible with bronchitis, acute versus chronic. Electronically Signed   By: Charlett Nose M.D.   On: 02/02/2022 20:03   DG Chest Portable 1 View  Result Date: 02/02/2022 CLINICAL DATA:  Shortness of breath, chest pain EXAM: PORTABLE CHEST 1 VIEW COMPARISON:  02/02/2022 FINDINGS: Cardiac size is within normal limits. Low position of diaphragms suggests COPD. Lung fields are clear of any infiltrates or pulmonary edema. There is no pleural effusion or pneumothorax. There is anterior surgical fusion in the  lower cervical spine. IMPRESSION: COPD.  There are no new infiltrates or signs of pulmonary edema. Electronically Signed   By: Ernie Avena M.D.   On: 02/02/2022 17:53    Procedures Procedures    Medications Ordered in ED Medications  magnesium sulfate IVPB 2 g 50 mL (has no administration in time range)  ipratropium-albuterol (DUONEB) 0.5-2.5 (3) MG/3ML nebulizer solution 3 mL (3 mLs Nebulization Given 02/02/22 1822)  methylPREDNISolone sodium succinate (SOLU-MEDROL) 125 mg/2 mL injection 125 mg (125 mg Intravenous Given 02/02/22 1822)  iohexol (OMNIPAQUE) 350 MG/ML injection 80 mL (80 mLs Intravenous Contrast Given 02/02/22 1941)  ipratropium-albuterol (DUONEB) 0.5-2.5 (3) MG/3ML nebulizer solution 3 mL (3 mLs Nebulization Given 02/02/22 2033)    ED Course/ Medical Decision Making/ A&P                           Medical Decision Making Amount and/or Complexity of Data Reviewed Labs:  ordered. Radiology: ordered.  Risk Prescription drug management.   This patient presents to the ED for concern of chest pain and shortness of breath, this involves an extensive number of treatment options, and is a complaint that carries with it a high risk of complications and morbidity.  The differential diagnosis includes PE, ACS, COPD exacerbation, asthma exacerbation, pneumothorax, other   Co morbidities that complicate the patient evaluation: COPD, cigarette smoking.   Additional history obtained: -Additional history obtained from patient spouse at bedside -External records from outside source obtained and reviewed including: Chart review including previous notes, labs, imaging, consultation notes   Lab Tests: -I ordered, reviewed, and interpreted labs.  The pertinent results include: Patient is COVID and flu negative.  He does have a leukocytosis of 14.1, he also has elevated RBC and hemoglobin.  Does not appear grossly hemoconcentrated given the other values are within normal limits.  He has no AKI, no gross electrolyte derangement.  Initial troponin negative.  No transaminitis.  EKG -Sinus tach with bilateral atrial enlargement.  No ischemic findings   Imaging Studies ordered: -I ordered imaging studies including EXTR, no evidence of underlying pathology.  There is COPD.  CTA without any signs of pulmonary embolism. -I independently visualized and interpreted imaging.  -I agree with the radiologist interpretation   Medicines ordered and prescription drug management: -I ordered medication including 2 DuoNebs, Solu-Medrol, magnesium for shortness of breath, COPD exacerbation -Reevaluation of the patient after these medicines showed that the patient improved -I have reviewed the patients home medicines and have made adjustments as needed   Consultations Obtained: I requested consultation with the social work,  and discussed lab and imaging findings as well as pertinent  plan - they recommend: Spoke with the social work team, was able to accomplish getting the patient home medicine.   ED Course: Heart score is 2. Curb score 65 low risk.   Patient presented he was tachypneic, also having significant wheezing and rhonchi.  Chest x-ray did not reveal pneumonia, CTA was also negative for pneumonia or PE.  He does have a slight leukocytosis of 14 but the CBC does appear hemoconcentrated.    After patient received mag, nebulizer twice, Solu-Medrol his lungs sound better.  He is no longer significantly tachypneic, his lungs sound clear to auscultation although there is some slight wheezing.  Pain is resolved.  Based on his risk factors, current presentation I do not feel strongly he needs to come into the hospital for COPD exacerbation.  He is not hypoxic,  he is moving air well.    I did do a consult with social work and was able to get the patient albuterol, doxycycline, prednisone with their help.  I did consider admission but I do not feel strongly at this time that it would be any different than what he can accomplish outpatient.    Obviously if he were to become hypoxic that would change, patient and I discussed this and he is agreeable to outpatient trial.  Treating with doxycycline as an atypical pneumonia, unclear etiology for the leukocytosis.    Cardiac Monitoring: The patient was maintained on a cardiac monitor.  I personally viewed and interpreted the cardiac monitored which showed an underlying rhythm of: Sinus rhythm   Reevaluation: After the interventions noted above, I reevaluated the patient and found that they have :improved   Dispostion: Discharged home with home medicine.         Final Clinical Impression(s) / ED Diagnoses Final diagnoses:  Atypical chest pain  Shortness of breath    Rx / DC Orders ED Discharge Orders          Ordered    doxycycline (VIBRAMYCIN) 100 MG capsule  2 times daily        02/02/22 2117     predniSONE (DELTASONE) 20 MG tablet        02/02/22 2117    albuterol (VENTOLIN HFA) 108 (90 Base) MCG/ACT inhaler  Every 6 hours PRN        02/02/22 2117              Theron AristaSage, Zyion Doxtater, PA-C 02/02/22 2128    Ernie AvenaLawsing, James, MD 02/03/22 [phone]

## 2022-02-02 NOTE — Discharge Instructions (Addendum)
Call the pulmonologist tomorrow to arrange an appointment to have pulmonary function testing done.  Use the albuterol inhaler every 6 hours as needed for wheezing.  Take the prednisone as prescribed.  Take the doxycycline twice daily for the next 10 days.  The medications will cost $9 total at the [facility] on [address] [place].

## 2022-02-02 NOTE — Discharge Instructions (Addendum)
Instructed patient to discontinue current OTC TheraFlu cold and cough.  Advised patient take medication as directed with food to completion.  Advised patient to take Prednisone with first dose of Doxycycline for the next 9 of 10 days.  Encouraged patient to increase daily water intake while taking these medications.

## 2022-02-02 NOTE — ED Provider Notes (Signed)
[facility]    CSN: [id number] Arrival date & time: 02/02/22  0805      History   Chief Complaint Chief Complaint  Patient presents with   Shortness of Breath   Cough   Abdominal Pain    HPI Mason Zuniga is a 51 y.o. male.   HPI 51 year old male presents with shortness of breath, cough, nasal congestion, abdominal pain and back pain x 2 days.  PMH significant for HTN, syncope, witnessed apneic spells, and asthma.  Patient currently taking no medications other than OTC TheraFlu cold and cough as needed.  Patient reports his current every day cigarette smoker, smoking roughly half a pack per day.  Past Medical History:  Diagnosis Date   Asthma    Depression    Hypertension    Witnessed apneic spells 03/20/2020    Patient Active Problem List   Diagnosis Date Noted   OSA (obstructive sleep apnea) 09/18/2020   Witnessed apneic spells 03/20/2020    Past Surgical History:  Procedure Laterality Date   HERNIA REPAIR         Home Medications    Prior to Admission medications   Medication Sig Start Date End Date Taking? Authorizing Provider  benzonatate (TESSALON) 200 MG capsule Take 1 capsule (200 mg total) by mouth 3 (three) times daily as needed for up to 7 days for cough. 02/02/22 02/09/22 Yes Eliezer Lofts, FNP  doxycycline (VIBRAMYCIN) 100 MG capsule Take 1 capsule (100 mg total) by mouth 2 (two) times daily for 10 days. 02/02/22 02/12/22 Yes Eliezer Lofts, FNP  Phenylephrine-Pheniramine-DM [facility] COLD & COUGH PO) Take by mouth.   Yes [provider]  predniSONE (DELTASONE) 20 MG tablet Take 3 tabs PO daily x 3 days, then 2 tabs PO daily x 3 days, then 1 tab PO daily x 3 days 02/02/22  Yes Eliezer Lofts, FNP    Family History Family History  Problem Relation Age of Onset   Hypertension Mother    Heart attack Father     Social History Social History   Tobacco Use   Smoking status: Every Day    Packs/day: 0.50    Types: Cigarettes    Smokeless tobacco: Never  Vaping Use   Vaping Use: Never used  Substance Use Topics   Alcohol use: Not Currently   Drug use: Not Currently     Allergies   Zoloft [sertraline]   Review of Systems Review of Systems  HENT:  Positive for congestion.   Respiratory:  Positive for shortness of breath.   Gastrointestinal:  Positive for abdominal pain.  Musculoskeletal:  Positive for back pain.    Physical Exam Triage Vital Signs ED Triage Vitals  Enc Vitals Group     BP 02/02/22 0822 (!) 141/90     Pulse Rate 02/02/22 0822 94     Resp 02/02/22 0822 (!) 28     Temp 02/02/22 0822 97.7 F (36.5 C)     Temp src --      SpO2 02/02/22 0822 94 %     Weight 02/02/22 0818 175 lb (79.4 kg)     Height 02/02/22 0818 5\' 11"  (1.803 m)     Head Circumference --      Peak Flow --      Pain Score 02/02/22 0818 7     Pain Loc --      Pain Edu? --      Excl. in [place]? --    No data found.  Updated Vital  Signs BP (!) 141/90    Pulse 94    Temp 97.7 F (36.5 C)    Resp (!) 28    Ht 5\' 11"  (1.803 m)    Wt 175 lb (79.4 kg)    SpO2 94%    BMI 24.41 kg/m       Physical Exam Vitals and nursing note reviewed.  Constitutional:      General: He is not in acute distress.    Appearance: He is well-developed and normal weight. He is ill-appearing.  HENT:     Mouth/Throat:     Mouth: Mucous membranes are moist.     Pharynx: Oropharynx is clear.  Eyes:     Extraocular Movements: Extraocular movements intact.     Pupils: Pupils are equal, round, and reactive to light.  Cardiovascular:     Rate and Rhythm: Normal rate and regular rhythm.     Heart sounds: No murmur heard.   No friction rub. No gallop.  Pulmonary:     Effort: Tachypnea present.     Breath sounds: Examination of the right-upper field reveals decreased breath sounds, wheezing and rhonchi. Examination of the left-upper field reveals decreased breath sounds, wheezing and rhonchi. Examination of the right-middle field reveals wheezing  and rhonchi. Examination of the right-lower field reveals decreased breath sounds, wheezing and rhonchi. Examination of the left-lower field reveals decreased breath sounds, wheezing and rhonchi. Decreased breath sounds, wheezing and rhonchi present.  Musculoskeletal:     Cervical back: Normal range of motion and neck supple.  Lymphadenopathy:     Cervical: No cervical adenopathy.  Skin:    General: Skin is warm and dry.  Neurological:     General: No focal deficit present.     Mental Status: He is alert and oriented to person, place, and time.     UC Treatments / Results  Labs (all labs ordered are listed, but only abnormal results are displayed) Labs Reviewed  POCT INFLUENZA A/B  POC SARS CORONAVIRUS 2 AG -  ED    EKG   Radiology DG Chest 2 View  Result Date: 02/02/2022 CLINICAL DATA:  Shortness of breath.  Cough. EXAM: CHEST - 2 VIEW COMPARISON:  02/24/2020 FINDINGS: The heart size and mediastinal contours are within normal limits. Pulmonary hyperinflation and central peribronchial thickening are noted, consistent with COPD. No evidence of pulmonary infiltrate or edema. No evidence of pleural effusion. Cervical spine fusion hardware again noted. IMPRESSION: COPD. No active cardiopulmonary disease. Electronically Signed   By: Marlaine Hind M.D.   On: 02/02/2022 08:49    Procedures Procedures (including critical care time)  Medications Ordered in UC Medications - No data to display  Initial Impression / Assessment and Plan / UC Course  I have reviewed the triage vital signs and the nursing notes.  Pertinent labs & imaging results that were available during my care of the patient were reviewed by me and considered in my medical decision making (see chart for details).     MDM: 1.  Shortness of breath-CXR revealed COPD and no active cardiopulmonary disease, no evidence of pulmonary infiltrate or edema. 2.  COPD exacerbation-Rx'd Doxycycline and Prednisone. Instructed patient to  discontinue current OTC TheraFlu cold and cough.  Advised patient take medication as directed with food to completion.  Advised patient to take Prednisone with first dose of Doxycycline for the next 9 of 10 days.  Encouraged patient to increase daily water intake while taking these medications.  Patient discharged home, hemodynamically stable. Final  Clinical Impressions(s) / UC Diagnoses   Final diagnoses:  Shortness of breath  COPD exacerbation ([place])     Discharge Instructions      Instructed patient to discontinue current OTC TheraFlu cold and cough.  Advised patient take medication as directed with food to completion.  Advised patient to take Prednisone with first dose of Doxycycline for the next 9 of 10 days.  Encouraged patient to increase daily water intake while taking these medications.     ED Prescriptions     Medication Sig Dispense Auth. Provider   doxycycline (VIBRAMYCIN) 100 MG capsule Take 1 capsule (100 mg total) by mouth 2 (two) times daily for 10 days. 20 capsule Eliezer Lofts, FNP   predniSONE (DELTASONE) 20 MG tablet Take 3 tabs PO daily x 3 days, then 2 tabs PO daily x 3 days, then 1 tab PO daily x 3 days 18 tablet Eliezer Lofts, FNP   benzonatate (TESSALON) 200 MG capsule Take 1 capsule (200 mg total) by mouth 3 (three) times daily as needed for up to 7 days for cough. 50 capsule Eliezer Lofts, FNP      PDMP not reviewed this encounter.   Eliezer Lofts, [place] 02/02/22 [phone]

## 2022-02-02 NOTE — Care Management (Addendum)
°  MATCH Medication Assistance Card Name:  Mason Zuniga ID (MRN): [id number] Bin: [id number] RX Group: [id number] Discharge Date: 02/02/2022 Expiration Date: 02/16/2022                                           (must be filled within 7 days of discharge)    Dear   :   Mason Zuniga  You have been approved to have the prescriptions written by your discharging physician filled through our [facility] (Medication Assistance Through [facility]) program. This program allows for a one-time (no refills) 34-day supply of selected medications for a low copay amount.  The copay is $3.00 per prescription. For instance, if you have one prescription, you will pay $3.00; for two prescriptions, you pay $6.00; for three prescriptions, you pay $9.00; and so on.  Only certain pharmacies are participating in this program with [facility]. You will need to select one of the pharmacies from the attached list and take your prescriptions, this letter, and your photo ID to one of the participating pharmacies.   We are excited that you are able to use the [facility] program to get your medications. These prescriptions must be filled within 7 days of hospital discharge or they will no longer be valid for the [facility] program. Should you have any problems with your prescriptions please contact your case management team member at [phone] for Mason Zuniga/[place]/ [facility].  Thank you, [facility] Care Management       ED RNCM received message from [facility] EDCSW patient needs medication assistance. Patient is unable to afford discharge medications, MATCH eligible, .Match Letter was sent to [facility] on [address]. Via [facility]. Updated EDP

## 2022-02-02 NOTE — ED Triage Notes (Signed)
Pt presents to Urgent Care with c/o shortness of breath, cough, nasal congestion, and abdominal & back pain x 2 days. Has not done a COVID test.

## 2022-02-13 ENCOUNTER — Other Ambulatory Visit: Payer: Self-pay

## 2022-02-13 ENCOUNTER — Ambulatory Visit (INDEPENDENT_AMBULATORY_CARE_PROVIDER_SITE_OTHER): Payer: Self-pay | Admitting: Pulmonary Disease

## 2022-02-13 ENCOUNTER — Encounter: Payer: Self-pay | Admitting: Pulmonary Disease

## 2022-02-13 VITALS — BP 120/68 | HR 70 | Temp 97.7°F | Ht 71.0 in | Wt 167.8 lb

## 2022-02-13 DIAGNOSIS — R062 Wheezing: Secondary | ICD-10-CM

## 2022-02-13 DIAGNOSIS — R0602 Shortness of breath: Secondary | ICD-10-CM

## 2022-02-13 DIAGNOSIS — F17200 Nicotine dependence, unspecified, uncomplicated: Secondary | ICD-10-CM

## 2022-02-13 DIAGNOSIS — F17210 Nicotine dependence, cigarettes, uncomplicated: Secondary | ICD-10-CM

## 2022-02-13 MED ORDER — BREZTRI AEROSPHERE 160-9-4.8 MCG/ACT IN AERO: 2.0000 | INHALATION_SPRAY | Freq: Two times a day (BID) | RESPIRATORY_TRACT | 0 refills | Status: DC

## 2022-02-13 NOTE — Progress Notes (Signed)
Smoking Cessation Counseling:   The patients current tobacco use: 30 years of smoking, just restarted, max 1 ppd, currently <0.5ppd The patient was advised to quit and impact of smoking on their health.  I assessed the patient's willingness to attempt to quit. I provided methods and skills for cessation. We reviewed medication management of smoking session drugs if appropriate. Resources to help quit smoking were provided. A smoking cessation quit date was set: April 1st  Follow-up was arranged in our clinic.  The amount of time spent counseling patient was 4 mins    Josephine Igo, DO  Pulmonary Critical Care 02/13/2022 10:14 AM

## 2022-02-13 NOTE — Patient Instructions (Addendum)
Thank you for visiting Dr. Tonia Brooms at [facility]. Today we recommend the following:  Orders Placed This Encounter  Procedures   Ambulatory Referral for Lung Cancer Scre   Pulmonary Function Test   Samples of breztri today Coupon card and new prescription  PFTs prior to next office visit   Return in about 8 weeks (around 04/10/2022) for with APP or Dr. Tonia Brooms.    Please do your part to reduce the spread of COVID-19.

## 2022-02-13 NOTE — Addendum Note (Signed)
Addended by: Arlyss Repress on: 02/13/2022 11:04 AM   Modules accepted: Orders

## 2022-02-13 NOTE — Addendum Note (Signed)
Addended by: Arlyss Repress on: 02/13/2022 11:12 AM   Modules accepted: Orders

## 2022-02-13 NOTE — Progress Notes (Signed)
Synopsis: Referred in feb 2023 for copd by No ref. provider found  Subjective:   PATIENT ID: Mason Zuniga GENDER: male DOB: 12-Jul-1971, MRN: [id number]  Chief Complaint  Patient presents with   Consult    Consult.     This is a 51 year old gentleman, past medical history of asthma, depression, hypertension patient is a current every day smoker, half a pack per day.  A few weeks ago patient developed shortness of breath when he woke up in the morning.  He went to the work and ultimately had to go to urgent care because he was having trouble breathing.  Patient was started on prednisone and albuterol.  He feels much better breathing better still has some chest tightness.  Albuterol use has decreased.  He had asthma as a kid.  Unfortunately has smoked for a long time.  Has smoked at least 30+ years he quit several years ago and then restarted to years ago currently smoking less than half a pack per day and has been trying to quit.   Past Medical History:  Diagnosis Date   Asthma    Depression    Hypertension    Witnessed apneic spells 03/20/2020     Family History  Problem Relation Age of Onset   Hypertension Mother    Heart attack Father      Past Surgical History:  Procedure Laterality Date   HERNIA REPAIR      Social History   Socioeconomic History   Marital status: Married    Spouse name: Not on file   Number of children: Not on file   Years of education: Not on file   Highest education level: Not on file  Occupational History   Not on file  Tobacco Use   Smoking status: Every Day    Packs/day: 0.50    Types: Cigarettes   Smokeless tobacco: Never  Vaping Use   Vaping Use: Never used  Substance and Sexual Activity   Alcohol use: Not Currently   Drug use: Not Currently   Sexual activity: Not on file  Other Topics Concern   Not on file  Social History Narrative   Not on file   Social Determinants of Health   Financial Resource Strain: Not on file  Food  Insecurity: Not on file  Transportation Needs: Not on file  Physical Activity: Not on file  Stress: Not on file  Social Connections: Not on file  Intimate Partner Violence: Not on file     Allergies  Allergen Reactions   Zoloft [Sertraline]      Outpatient Medications Prior to Visit  Medication Sig Dispense Refill   albuterol (VENTOLIN HFA) 108 (90 Base) MCG/ACT inhaler Inhale 1-2 puffs into the lungs every 6 (six) hours as needed for wheezing or shortness of breath. 18 g 0   Phenylephrine-Pheniramine-DM (THERAFLU COLD & COUGH PO) Take by mouth.     predniSONE (DELTASONE) 20 MG tablet Take 3 tabs PO daily x 3 days, then 2 tabs PO daily x 3 days, then 1 tab PO daily x 3 days 18 tablet 0   No facility-administered medications prior to visit.    Review of Systems  Constitutional:  Negative for chills, fever, malaise/fatigue and weight loss.  HENT:  Negative for hearing loss, sore throat and tinnitus.   Eyes:  Negative for blurred vision and double vision.  Respiratory:  Positive for cough and shortness of breath. Negative for hemoptysis, sputum production, wheezing and stridor.   Cardiovascular:  Negative  for chest pain, palpitations, orthopnea, leg swelling and PND.  Gastrointestinal:  Negative for abdominal pain, constipation, diarrhea, heartburn, nausea and vomiting.  Genitourinary:  Negative for dysuria, hematuria and urgency.  Musculoskeletal:  Negative for joint pain and myalgias.  Skin:  Negative for itching and rash.  Neurological:  Negative for dizziness, tingling, weakness and headaches.  Endo/Heme/Allergies:  Negative for environmental allergies. Does not bruise/bleed easily.  Psychiatric/Behavioral:  Negative for depression. The patient is not nervous/anxious and does not have insomnia.   All other systems reviewed and are negative.   Objective:  Physical Exam Vitals reviewed.  Constitutional:      General: He is not in acute distress.    Appearance: He is  well-developed.  HENT:     Head: Normocephalic and atraumatic.  Eyes:     General: No scleral icterus.    Conjunctiva/sclera: Conjunctivae normal.     Pupils: Pupils are equal, round, and reactive to light.  Neck:     Vascular: No JVD.     Trachea: No tracheal deviation.  Cardiovascular:     Rate and Rhythm: Normal rate and regular rhythm.     Heart sounds: Normal heart sounds. No murmur heard. Pulmonary:     Effort: Pulmonary effort is normal. No tachypnea, accessory muscle usage or respiratory distress.     Breath sounds: No stridor. No wheezing, rhonchi or rales.  Abdominal:     General: There is no distension.     Palpations: Abdomen is soft.     Tenderness: There is no abdominal tenderness.  Musculoskeletal:        General: No tenderness.     Cervical back: Neck supple.  Lymphadenopathy:     Cervical: No cervical adenopathy.  Skin:    General: Skin is warm and dry.     Capillary Refill: Capillary refill takes less than 2 seconds.     Findings: No rash.  Neurological:     Mental Status: He is alert and oriented to person, place, and time.  Psychiatric:        Behavior: Behavior normal.     Vitals:   02/13/22 1001  BP: 120/68  Pulse: 70  Temp: 97.7 F (36.5 C)  TempSrc: Oral  SpO2: 100%  Weight: 167 lb 12.8 oz (76.1 kg)  Height: 5\' 11"  (1.803 m)   100% on RA BMI Readings from Last 3 Encounters:  02/13/22 23.40 kg/m  02/02/22 24.41 kg/m  09/18/20 25.80 kg/m   Wt Readings from Last 3 Encounters:  02/13/22 167 lb 12.8 oz (76.1 kg)  02/02/22 175 lb (79.4 kg)  09/18/20 185 lb (83.9 kg)     CBC    Component Value Date/Time   WBC 14.1 (H) 02/02/2022 1809   RBC 5.89 (H) 02/02/2022 1809   HGB 17.3 (H) 02/02/2022 1809   HCT 50.1 02/02/2022 1809   PLT 241 02/02/2022 1809   MCV 85.1 02/02/2022 1809   MCH 29.4 02/02/2022 1809   MCHC 34.5 02/02/2022 1809   RDW 13.0 02/02/2022 1809    Chest Imaging: 02/02/2022 CTA chest: No evidence of pulmonary  embolus, evidence of acute bronchitis peribronchial thickening. The patient's images have been independently reviewed by me.    Pulmonary Functions Testing Results: No flowsheet data found.  FeNO:   Pathology:   Echocardiogram:   Heart Catheterization:     Assessment & Plan:     ICD-10-CM   1. Smoker  F17.200 Pulmonary Function Test    Ambulatory Referral for Lung Cancer Scre  2. SOB (shortness of breath)  R06.02     3. Wheeze  R06.2       Discussion: This is a 51 year old gentleman longstanding history of tobacco use, 30+ years, quit for a significant period of time and fortunately restarted 2 years ago.  Recently treated for what sounds to be an acute exacerbation of COPD treated with prednisone and albuterol.  He feels better after prednisone.  He has a history of asthma as a kid.  Plan: We will start him on triple therapy inhaler regimen, Breztri Samples of Breztri given today to help with cost as his insurance has not started. He has a new job and his insurance should start in the next month or so. 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 Continue albuterol as needed. Follow-up with [place] in approximately 8 weeks. Please see separate documentation regarding smoking cessation counseling. Hopeful quit date for March 28, 2022. We will have pulmonary function test complete prior to next office visit. Referral placed ambulatory lung cancer screening program    Current Outpatient Medications:    albuterol (VENTOLIN HFA) 108 (90 Base) MCG/ACT inhaler, Inhale 1-2 puffs into the lungs every 6 (six) hours as needed for wheezing or shortness of breath., Disp: 18 g, Rfl: 0   Phenylephrine-Pheniramine-DM (THERAFLU COLD & COUGH PO), Take by mouth., Disp: , Rfl:    predniSONE (DELTASONE) 20 MG tablet, Take 3 tabs PO daily x 3 days, then 2 tabs PO daily x 3 days, then 1 tab PO daily x 3 days, Disp: 18  tablet, Rfl: 0   Garner Nash, DO [facility] 02/13/2022 10:23 AM

## 2022-02-17 ENCOUNTER — Telehealth: Payer: Self-pay | Admitting: Acute Care

## 2022-02-17 NOTE — Telephone Encounter (Signed)
Returned call for LCS referral inquiry.  Patient is not home.  Spouse states he wanted to set up a LCS CT scan.  Patient had a scan in Feb 2023 in ER.  Will need to get updated report to determine when initial LDCT scan will be due. Advised we will call back to discuss with patient.

## 2022-02-23 NOTE — Telephone Encounter (Signed)
Left voicemail for patient to call back regarding when he can begin the first CT for LCS.  Patient is new referral. Had CTA on 02/02/22 and radiology updated it to show no pulmonary nodules.  Initial scan and sdmv would begin in Feb 2024 due to the CTA.  Will update referral,

## 2022-04-13 ENCOUNTER — Ambulatory Visit (INDEPENDENT_AMBULATORY_CARE_PROVIDER_SITE_OTHER): Payer: Self-pay | Admitting: Pulmonary Disease

## 2022-04-13 ENCOUNTER — Encounter: Payer: Self-pay | Admitting: Pulmonary Disease

## 2022-04-13 VITALS — BP 116/60 | HR 74 | Ht 71.0 in | Wt 170.8 lb

## 2022-04-13 DIAGNOSIS — F17200 Nicotine dependence, unspecified, uncomplicated: Secondary | ICD-10-CM

## 2022-04-13 DIAGNOSIS — J4520 Mild intermittent asthma, uncomplicated: Secondary | ICD-10-CM

## 2022-04-13 DIAGNOSIS — R0602 Shortness of breath: Secondary | ICD-10-CM

## 2022-04-13 NOTE — Progress Notes (Signed)
? ?Synopsis: Referred in feb 2023 for copd by No ref. provider found ? ?Subjective:  ? ?PATIENT ID: Mason Zuniga GENDER: male DOB: Mar 28, 1971, MRN: [id number] ? ?Chief Complaint  ?Patient presents with  ? Follow-up  ?  Pt states he has been doing okay since last visit and denies any complaints.  ? ? ?This is a 51 year old gentleman, past medical history of asthma, depression, hypertension patient is a current every day smoker, half a pack per day.  A few weeks ago patient developed shortness of breath when he woke up in the morning.  He went to the work and ultimately had to go to urgent care because he was having trouble breathing.  Patient was started on prednisone and albuterol.  He feels much better breathing better still has some chest tightness.  Albuterol use has decreased.  He had asthma as a kid.  Unfortunately has smoked for a long time.  Has smoked at least 30+ years he quit several years ago and then restarted to years ago currently smoking less than half a pack per day and has been trying to quit. ? ?OV 04/13/2022: Here today for follow-up.  He is doing better since his recent exacerbation was treated with steroids.  He has a past medical history of asthma and unfortunately still smoking.  We talked about the importance of smoking cessation today in the office.  He has not had his pulmonary function test completed even though they were ordered at last office visit.  He has smoked for a long time.  He was enrolled in our lung cancer screening program but this was pushed off until February of next year as he went to the emergency room and had a CT scan of the chest.  These results were also reviewed today in the office. ? ? ?Past Medical History:  ?Diagnosis Date  ? Asthma   ? Depression   ? Hypertension   ? Witnessed apneic spells 03/20/2020  ?  ? ?Family History  ?Problem Relation Age of Onset  ? Hypertension Mother   ? Heart attack Father   ?  ? ?Past Surgical History:  ?Procedure Laterality Date  ?  HERNIA REPAIR    ? ? ?Social History  ? ?Socioeconomic History  ? Marital status: Married  ?  Spouse name: Not on file  ? Number of children: Not on file  ? Years of education: Not on file  ? Highest education level: Not on file  ?Occupational History  ? Not on file  ?Tobacco Use  ? Smoking status: Every Day  ?  Packs/day: 1.00  ?  Types: Cigarettes  ?  Start date: 1  ? Smokeless tobacco: Never  ? Tobacco comments:  ?  Currently smoking 1ppd as of 04/13/22 ep. Quit for 17 years and then started back about 2 years ago  ?Vaping Use  ? Vaping Use: Never used  ?Substance and Sexual Activity  ? Alcohol use: Not Currently  ? Drug use: Not Currently  ? Sexual activity: Not on file  ?Other Topics Concern  ? Not on file  ?Social History Narrative  ? Not on file  ? ?Social Determinants of Health  ? ?Financial Resource Strain: Not on file  ?Food Insecurity: Not on file  ?Transportation Needs: Not on file  ?Physical Activity: Not on file  ?Stress: Not on file  ?Social Connections: Not on file  ?Intimate Partner Violence: Not on file  ?  ? ?Allergies  ?Allergen Reactions  ? Zoloft [Sertraline]   ?  ? ?  Outpatient Medications Prior to Visit  ?Medication Sig Dispense Refill  ? albuterol (VENTOLIN HFA) 108 (90 Base) MCG/ACT inhaler Inhale 1-2 puffs into the lungs every 6 (six) hours as needed for wheezing or shortness of breath. 18 g 0  ? Budeson-Glycopyrrol-Formoterol (BREZTRI AEROSPHERE) 160-9-4.8 MCG/ACT AERO Inhale 2 puffs into the lungs in the morning and at bedtime. 10.7 g 0  ? Phenylephrine-Pheniramine-DM (THERAFLU COLD & COUGH PO) Take by mouth.    ? Budeson-Glycopyrrol-Formoterol (BREZTRI AEROSPHERE) 160-9-4.8 MCG/ACT AERO Inhale 2 puffs into the lungs in the morning and at bedtime. 10.7 g 0  ? predniSONE (DELTASONE) 20 MG tablet Take 3 tabs PO daily x 3 days, then 2 tabs PO daily x 3 days, then 1 tab PO daily x 3 days 18 tablet 0  ? ?No facility-administered medications prior to visit.  ? ? ?Review of Systems   ?Constitutional:  Negative for chills, fever, malaise/fatigue and weight loss.  ?HENT:  Negative for hearing loss, sore throat and tinnitus.   ?Eyes:  Negative for blurred vision and double vision.  ?Respiratory:  Positive for cough and shortness of breath. Negative for hemoptysis, sputum production, wheezing and stridor.   ?Cardiovascular:  Negative for chest pain, palpitations, orthopnea, leg swelling and PND.  ?Gastrointestinal:  Negative for abdominal pain, constipation, diarrhea, heartburn, nausea and vomiting.  ?Genitourinary:  Negative for dysuria, hematuria and urgency.  ?Musculoskeletal:  Negative for joint pain and myalgias.  ?Skin:  Negative for itching and rash.  ?Neurological:  Negative for dizziness, tingling, weakness and headaches.  ?Endo/Heme/Allergies:  Negative for environmental allergies. Does not bruise/bleed easily.  ?Psychiatric/Behavioral:  Negative for depression. The patient is not nervous/anxious and does not have insomnia.   ?All other systems reviewed and are negative. ? ? ?Objective:  ?Physical Exam ?Vitals reviewed.  ?Constitutional:   ?   General: He is not in acute distress. ?   Appearance: He is well-developed.  ?HENT:  ?   Head: Normocephalic and atraumatic.  ?Eyes:  ?   General: No scleral icterus. ?   Conjunctiva/sclera: Conjunctivae normal.  ?   Pupils: Pupils are equal, round, and reactive to light.  ?Neck:  ?   Vascular: No JVD.  ?   Trachea: No tracheal deviation.  ?Cardiovascular:  ?   Rate and Rhythm: Normal rate and regular rhythm.  ?   Heart sounds: Normal heart sounds. No murmur heard. ?Pulmonary:  ?   Effort: Pulmonary effort is normal. No tachypnea, accessory muscle usage or respiratory distress.  ?   Breath sounds: No stridor. No wheezing, rhonchi or rales.  ?Abdominal:  ?   General: There is no distension.  ?   Palpations: Abdomen is soft.  ?   Tenderness: There is no abdominal tenderness.  ?Musculoskeletal:     ?   General: No tenderness.  ?   Cervical back: Neck  supple.  ?Lymphadenopathy:  ?   Cervical: No cervical adenopathy.  ?Skin: ?   General: Skin is warm and dry.  ?   Capillary Refill: Capillary refill takes less than 2 seconds.  ?   Findings: No rash.  ?Neurological:  ?   Mental Status: He is alert and oriented to person, place, and time.  ?Psychiatric:     ?   Behavior: Behavior normal.  ? ? ? ?Vitals:  ? 04/13/22 1551  ?BP: 116/60  ?Pulse: 74  ?SpO2: 98%  ?Weight: 170 lb 12.8 oz (77.5 kg)  ?Height: 5\' 11"  (1.803 m)  ? ?98% on RA ?BMI  Readings from Last 3 Encounters:  ?04/13/22 23.82 kg/m?  ?02/13/22 23.40 kg/m?  ?02/02/22 24.41 kg/m?  ? ?Wt Readings from Last 3 Encounters:  ?04/13/22 170 lb 12.8 oz (77.5 kg)  ?02/13/22 167 lb 12.8 oz (76.1 kg)  ?02/02/22 175 lb (79.4 kg)  ? ? ? ?CBC ?   ?Component Value Date/Time  ? WBC 14.1 (H) 02/02/2022 1809  ? RBC 5.89 (H) 02/02/2022 1809  ? HGB 17.3 (H) 02/02/2022 1809  ? HCT 50.1 02/02/2022 1809  ? PLT 241 02/02/2022 1809  ? MCV 85.1 02/02/2022 1809  ? MCH 29.4 02/02/2022 1809  ? MCHC 34.5 02/02/2022 1809  ? RDW 13.0 02/02/2022 1809  ? ? ?Chest Imaging: ?02/02/2022 CTA chest: ?No evidence of pulmonary embolus, evidence of acute bronchitis peribronchial thickening. ?The patient's images have been independently reviewed by me.   ? ?Pulmonary Functions Testing Results: ?   ? View : No data to display.  ?  ?  ?  ? ? ?FeNO:  ? ?Pathology:  ? ?Echocardiogram:  ? ?Heart Catheterization:  ?   ?Assessment & Plan:  ? ?  ICD-10-CM   ?1. Smoker  F17.200 Pulmonary Function Test  ?  ?2. SOB (shortness of breath)  R06.02 Pulmonary Function Test  ?  ?3. Mild intermittent asthma without complication  J45.20   ?  ? ?Discussion: ? ?This is a 51 year old gentleman longstanding history of tobacco abuse, 30+ years.  Unfortunately restarted smoking 2 years ago.  He probably has underlying COPD has not had any pulmonary function test.  He also has a longstanding history of asthma since childhood.  He may have a mixed picture related to copd and  asthma.  ? ?Plan: ?Plan for him to continue triple therapy regimen with Breztri. ?He needs to quit smoking.  ?We talked about this today and various methods for this  ?PFTs have been ordered and hopefully scheduled today

## 2022-04-13 NOTE — Patient Instructions (Addendum)
Thank you for visiting Dr. Tonia Brooms at [facility]. ?Today we recommend the following: ? ?Orders Placed This Encounter  ?Procedures  ? Pulmonary Function Test  ? ?Return in about 6 weeks (around 05/25/2022) for with APP. After PFTs complete.  ? ? ? ?Please do your part to reduce the spread of COVID-19.  ? ?

## 2022-05-29 ENCOUNTER — Ambulatory Visit (INDEPENDENT_AMBULATORY_CARE_PROVIDER_SITE_OTHER): Payer: Self-pay | Admitting: Pulmonary Disease

## 2022-05-29 ENCOUNTER — Ambulatory Visit (INDEPENDENT_AMBULATORY_CARE_PROVIDER_SITE_OTHER): Payer: Self-pay | Admitting: Primary Care

## 2022-05-29 ENCOUNTER — Encounter: Payer: Self-pay | Admitting: Primary Care

## 2022-05-29 DIAGNOSIS — J4520 Mild intermittent asthma, uncomplicated: Secondary | ICD-10-CM | POA: Insufficient documentation

## 2022-05-29 DIAGNOSIS — F17200 Nicotine dependence, unspecified, uncomplicated: Secondary | ICD-10-CM

## 2022-05-29 DIAGNOSIS — R0602 Shortness of breath: Secondary | ICD-10-CM

## 2022-05-29 LAB — PULMONARY FUNCTION TEST
DL/VA % pred: 141 %
DL/VA: 6.2 ml/min/mmHg/L
DLCO cor % pred: 134 %
DLCO cor: 40.18 ml/min/mmHg
DLCO unc % pred: 134 %
DLCO unc: 40.18 ml/min/mmHg
FEF 25-75 Post: 3.35 L/sec
FEF 25-75 Pre: 2.51 L/sec
FEF2575-%Change-Post: 33 %
FEF2575-%Pred-Post: 100 %
FEF2575-%Pred-Pre: 75 %
FEV1-%Change-Post: 8 %
FEV1-%Pred-Post: 112 %
FEV1-%Pred-Pre: 103 %
FEV1-Post: 3.85 L
FEV1-Pre: 3.57 L
FEV1FVC-%Change-Post: 11 %
FEV1FVC-%Pred-Pre: 89 %
FEV6-%Change-Post: -4 %
FEV6-%Pred-Post: 112 %
FEV6-%Pred-Pre: 117 %
FEV6-Post: 4.7 L
FEV6-Pre: 4.93 L
FEV6FVC-%Change-Post: 0 %
FEV6FVC-%Pred-Post: 102 %
FEV6FVC-%Pred-Pre: 102 %
FVC-%Change-Post: -3 %
FVC-%Pred-Post: 111 %
FVC-%Pred-Pre: 115 %
FVC-Post: 4.79 L
FVC-Pre: 4.97 L
Post FEV1/FVC ratio: 80 %
Post FEV6/FVC ratio: 100 %
Pre FEV1/FVC ratio: 72 %
Pre FEV6/FVC Ratio: 99 %
RV % pred: 144 %
RV: 3.09 L
TLC % pred: 105 %
TLC: 7.59 L

## 2022-05-29 NOTE — Patient Instructions (Signed)
Pulmonary function testing today was normal, more consistent with asthma than COPD  Okay for now to stay off Breztri or another maintenance inhaler  Would use albuterol as needed 2 puffs every 4-6 hours for breakthrough shortness of breath or wheezing  If you have recurrent exacerbations would recommend starting daily steroid inhaler +/- bronchodilator  Follow-up 6 months with Dr. Tonia Brooms or APP

## 2022-05-29 NOTE — Progress Notes (Signed)
Full PFT Performed Today  

## 2022-05-29 NOTE — Progress Notes (Signed)
@Patient  ID: , male    DOB: 05-02-1971, 51 y.o.   MRN: [id number]  No chief complaint on file.   Referring provider: No ref. provider found  HPI: 51 year old male, current every day smoker.  Past medical history significant for OSA, tobacco abuse.  Patient of Dr. 44, for initial consult back in February for shortness of breath.  05/29/2022 Presents today for 6-week follow-up with PFTs. Patient is a current smoker with longstanding history of tobacco use.  He had asthma as a child.  During his last visit with Dr. 07/29/2022 in April he was started on triple therapy inhaler regimen with Breztri. Patient is doing well today without acute complaints.  He used Breztri samples for 2 weeks with improvement in symptoms.  After stopping inhaler his symptoms did not returned.  He typically flares on average 1-2 times a year.  He has not required as needed albuterol and several months.  Pulmonary function testing 05/29/2022 FVC 4.79 (111%), FEV1 3.85 (112%), ratio 80, TLC 105%, DLCO 40.18 (134%) Normal lung function. No significant BD response. Increased diffusion capacity    Allergies  Allergen Reactions   Zoloft [Sertraline]      There is no immunization history on file for this patient.  Past Medical History:  Diagnosis Date   Asthma    Depression    Hypertension    Witnessed apneic spells 03/20/2020    Tobacco History: Social History   Tobacco Use  Smoking Status Every Day   Packs/day: 0.50   Types: Cigarettes   Start date: 1985  Smokeless Tobacco Never  Tobacco Comments   Currently smoking 1ppd as of 04/13/22 ep. Quit for 17 years and then started back about 2 years ago   Ready to quit: Not Answered Counseling given: Not Answered Tobacco comments: Currently smoking 1ppd as of 04/13/22 ep. Quit for 17 years and then started back about 2 years ago   Outpatient Medications Prior to Visit  Medication Sig Dispense Refill   albuterol (VENTOLIN HFA) 108 (90 Base)  MCG/ACT inhaler Inhale 1-2 puffs into the lungs every 6 (six) hours as needed for wheezing or shortness of breath. 18 g 0   Budeson-Glycopyrrol-Formoterol (BREZTRI AEROSPHERE) 160-9-4.8 MCG/ACT AERO Inhale 2 puffs into the lungs in the morning and at bedtime. 10.7 g 0   Phenylephrine-Pheniramine-DM (THERAFLU COLD & COUGH PO) Take by mouth. (Patient not taking: Reported on 05/29/2022)     No facility-administered medications prior to visit.    Review of Systems  Review of Systems  Constitutional: Negative.   HENT: Negative.    Respiratory: Negative.  Negative for cough, shortness of breath and wheezing.     Physical Exam  BP 110/60   Pulse 72   Temp 98 F (36.7 C) (Oral)   Ht 5\' 11"  (1.803 m)   Wt 162 lb (73.5 kg)   SpO2 100%   BMI 22.59 kg/m  Physical Exam Constitutional:      General: He is not in acute distress.    Appearance: Normal appearance. He is not ill-appearing.  HENT:     Head: Normocephalic and atraumatic.     Mouth/Throat:     Mouth: Mucous membranes are moist.     Pharynx: Oropharynx is clear.  Cardiovascular:     Rate and Rhythm: Normal rate and regular rhythm.  Pulmonary:     Effort: Pulmonary effort is normal.     Breath sounds: Normal breath sounds. No wheezing, rhonchi or rales.  Skin:    General:  Skin is warm and dry.  Neurological:     General: No focal deficit present.     Mental Status: He is alert and oriented to person, place, and time. Mental status is at baseline.  Psychiatric:        Mood and Affect: Mood normal.        Behavior: Behavior normal.        Thought Content: Thought content normal.        Judgment: Judgment normal.     Lab Results:  CBC    Component Value Date/Time   WBC 14.1 (H) 02/02/2022 1809   RBC 5.89 (H) 02/02/2022 1809   HGB 17.3 (H) 02/02/2022 1809   HCT 50.1 02/02/2022 1809   PLT 241 02/02/2022 1809   MCV 85.1 02/02/2022 1809   MCH 29.4 02/02/2022 1809   MCHC 34.5 02/02/2022 1809   RDW 13.0 02/02/2022 1809     BMET    Component Value Date/Time   NA 135 02/02/2022 1809   K 4.4 02/02/2022 1809   CL 100 02/02/2022 1809   CO2 27 02/02/2022 1809   GLUCOSE 134 (H) 02/02/2022 1809   BUN 12 02/02/2022 1809   CREATININE 1.00 02/02/2022 1809   CALCIUM 9.3 02/02/2022 1809   GFRNONAA >60 02/02/2022 1809   GFRAA >60 02/24/2020 2223    BNP    Component Value Date/Time   BNP 43.1 02/02/2022 1809    ProBNP No results found for: PROBNP  Imaging: No results found.   Assessment & Plan:   Intermittent asthma, well controlled - Former smoker, hx asthma as a child.  Pulmonary function testing today showed normal lung function with borderline bronchodilator response.  Increased diffusion capacity.  Findings consistent with asthma.  No evidence of obstructive lung disease.  Patient is currently asymptomatic.  He does occasionally experience exertional dyspnea.  Respiratory symptoms typically flare twice a year when acutely ill. Okay for now to stay off Breztri. Recommend patient continue as needed albuterol 2 puffs every 4-6 hours as needed for breakthrough shortness of breath or wheezing.  If symptoms exacerbate would recommend adding daily ICS +/- long-acting bronchodilator. FU in 6 months or sooner if needed.    Glenford Bayley, NP 05/29/2022

## 2022-05-29 NOTE — Patient Instructions (Signed)
Full PFT Performed Today  

## 2022-05-29 NOTE — Assessment & Plan Note (Addendum)
-   Former smoker, hx asthma as a child.  Pulmonary function testing today showed normal lung function with borderline bronchodilator response.  Increased diffusion capacity.  Findings consistent with asthma.  No evidence of obstructive lung disease.  Patient is currently asymptomatic.  He does occasionally experience exertional dyspnea.  Respiratory symptoms typically flare twice a year when acutely ill. Okay for now to stay off Breztri. Recommend patient continue as needed albuterol 2 puffs every 4-6 hours as needed for breakthrough shortness of breath or wheezing.  If symptoms exacerbate would recommend adding daily ICS +/- long-acting bronchodilator. FU in 6 months or sooner if needed.

## 2022-11-30 ENCOUNTER — Ambulatory Visit: Payer: Self-pay | Admitting: Primary Care

## 2023-01-28 ENCOUNTER — Encounter: Payer: Self-pay | Admitting: *Deleted

## 2023-01-31 ENCOUNTER — Ambulatory Visit (HOSPITAL_COMMUNITY)
Admission: EM | Admit: 2023-01-31 | Discharge: 2023-01-31 | Disposition: A | Discharge status: Home / Self Care | Payer: BC Managed Care – PPO

## 2023-01-31 DIAGNOSIS — F1410 Cocaine abuse, uncomplicated: Secondary | ICD-10-CM | POA: Diagnosis not present

## 2023-01-31 NOTE — ED Triage Notes (Signed)
Pt presents to [facility] voluntarily accompanied by his wife seeking substance use treatment resources. Pt states he uses cocaine just about every weekend, approximately half an ounce. Pt reports last use was 2 days ago, about $300 worth. Pt states he was clean for 20 years and relapsed 2 years ago. Pt denies SI/HI and AVH.

## 2023-01-31 NOTE — ED Provider Notes (Cosign Needed Addendum)
Behavioral Health Urgent Care Medical Screening Exam  Patient Name: Mason Zuniga MRN: [id number] Date of Evaluation: 01/31/23 Chief Complaint:  seeking substance abuse treatment Diagnosis:  Final diagnoses:  Cocaine abuse ([place])   History of Present illness: Mason Zuniga is a 52 y.o. male.  Presents  to [facility] seeking substance abuse treatment resources for cocaine.  Mason Zuniga reports Mason Zuniga last used cocaine 2 nights ago.  States using using $300 worth of cocaine.  Reports Mason Zuniga has been clean and sober for the 20 years and recently relapsed 2 years ago.  Denied that Mason Zuniga has ever followed through with a residential treatment program. Zuniga " they are trying to send me far away." Patient was encouraged to accepted treat resources that are available.   Currently Mason Zuniga is denying suicidal or homicidal ideations.  Denies auditory or visual hallucinations. Denied that Mason Zuniga is followed by therapy or psychiatry currently.  Mason Zuniga occasional mariajuana use.    Mason Zuniga is sitting in no acute distress. Mason Zuniga is alert/oriented x 4; calm/cooperative; and mood congruent with affect. Mason Zuniga is speaking in a clear tone at moderate volume, and normal pace; with good eye contact. His thought process is coherent and relevant; There is no indication that Mason Zuniga is currently responding to internal/external stimuli or experiencing delusional thought content; and Mason Zuniga has denied suicidal/self-harm/homicidal ideation, psychosis, and paranoia.   Patient has remained calm throughout assessment and has answered questions appropriately.    At this time Mason Zuniga is educated and verbalizes understanding of mental health resources and other crisis services in the community.Mason Zuniga is instructed to call 911 and present to the nearest emergency room should Mason Zuniga experience any suicidal/homicidal ideation, auditory/visual/hallucinations, or detrimental worsening of his mental health condition.Mason Zuniga was a also advised by Probation officer that Mason Zuniga  could call the toll-free phone on insurance card to assist with identifying in network counselors and agencies or number on back of Medicaid card to speak with care coordinator   [place] ED from 01/31/2023 in [facility] ED from 02/02/2022 in [facility] Emergency Department at [place] No Risk No Risk       Psychiatric Specialty Exam  Presentation  General Appearance:Appropriate for Environment  Eye Contact:Good  Speech:Clear and Coherent  Speech Volume:Normal  Handedness:Right   Mood and Affect  Mood: Euthymic  Affect: Congruent   Thought Process  Thought Processes: Coherent  Descriptions of Associations:Intact  Orientation:Full (Time, Place and Person)  Thought Content:Other (comment)    Hallucinations:None  Ideas of Reference:None  Suicidal Thoughts:No  Homicidal Thoughts:No   Sensorium  Memory: Immediate Fair; Recent Fair; Remote Fair  Judgment: Good  Insight: Fair   Community education officer  Concentration: Fair  Attention Span: Good  Recall: Good  Fund of Knowledge: Good  Language: Fair   Psychomotor Activity  Psychomotor Activity: Normal   Assets  Assets: Desire for Improvement; Social Support   Sleep  Sleep: Fair  Number of hours:  8   Physical Exam: Physical Exam Vitals and nursing note reviewed.  Cardiovascular:     Rate and Rhythm: Normal rate and regular rhythm.  Skin:    General: Skin is warm and dry.  Neurological:     Mental Status: Mason Zuniga is alert and oriented to person, place, and time.  Psychiatric:        Mood and Affect: Mood normal.        Behavior: Behavior normal.    Review of Systems  Constitutional: Negative.  Respiratory: Negative.    Cardiovascular: Negative.   Psychiatric/Behavioral:  Positive for substance abuse. Negative for depression and suicidal ideas.   All other systems reviewed and are negative.  Blood pressure  121/70, pulse 80, temperature 97.6 F (36.4 C), temperature source Oral, resp. rate 18, SpO2 99 %. There is no height or weight on file to calculate BMI.  Musculoskeletal: Strength & Muscle Tone: within normal limits Gait & Station: normal Patient leans: N/A   [facility] Discharge Disposition for Follow up and Recommendations: Based on my evaluation the patient does not appear to have an emergency medical condition and can be discharged with resources and follow up care in outpatient services for Substance Abuse Intensive Outpatient Program   Derrill [place], NP 01/31/2023, 3:34 PM

## 2023-01-31 NOTE — Discharge Instructions (Addendum)

## 2023-03-09 ENCOUNTER — Encounter (HOSPITAL_COMMUNITY): Payer: Self-pay | Admitting: Student

## 2023-03-09 ENCOUNTER — Other Ambulatory Visit (HOSPITAL_COMMUNITY)
Admission: EM | Admit: 2023-03-09 | Discharge: 2023-03-11 | Disposition: A | Discharge status: Home / Self Care | Payer: BC Managed Care – PPO | Attending: Psychiatry | Admitting: Psychiatry

## 2023-03-09 ENCOUNTER — Emergency Department (HOSPITAL_COMMUNITY)
Admission: EM | Admit: 2023-03-09 | Discharge: 2023-03-09 | Disposition: A | Discharge status: Rehabilitation Facility | Payer: BC Managed Care – PPO | Attending: Emergency Medicine | Admitting: Emergency Medicine

## 2023-03-09 DIAGNOSIS — R001 Bradycardia, unspecified: Secondary | ICD-10-CM | POA: Diagnosis not present

## 2023-03-09 DIAGNOSIS — F419 Anxiety disorder, unspecified: Secondary | ICD-10-CM | POA: Insufficient documentation

## 2023-03-09 DIAGNOSIS — F1410 Cocaine abuse, uncomplicated: Secondary | ICD-10-CM | POA: Diagnosis present

## 2023-03-09 DIAGNOSIS — F329 Major depressive disorder, single episode, unspecified: Secondary | ICD-10-CM | POA: Insufficient documentation

## 2023-03-09 DIAGNOSIS — Z1152 Encounter for screening for COVID-19: Secondary | ICD-10-CM | POA: Diagnosis not present

## 2023-03-09 DIAGNOSIS — R45851 Suicidal ideations: Secondary | ICD-10-CM | POA: Diagnosis not present

## 2023-03-09 LAB — CBC
HCT: 46.3 % (ref 39.0–52.0)
Hemoglobin: 15.3 g/dL (ref 13.0–17.0)
MCH: 29.1 pg (ref 26.0–34.0)
MCHC: 33 g/dL (ref 30.0–36.0)
MCV: 88 fL (ref 80.0–100.0)
Platelets: 179 10*3/uL (ref 150–400)
RBC: 5.26 MIL/uL (ref 4.22–5.81)
RDW: 12.9 % (ref 11.5–15.5)
WBC: 7.9 10*3/uL (ref 4.0–10.5)
nRBC: 0 % (ref 0.0–0.2)

## 2023-03-09 LAB — RESP PANEL BY RT-PCR (RSV, FLU A&B, COVID)  RVPGX2
Influenza A by PCR: NEGATIVE
Influenza B by PCR: NEGATIVE
Resp Syncytial Virus by PCR: NEGATIVE
SARS Coronavirus 2 by RT PCR: NEGATIVE

## 2023-03-09 LAB — COMPREHENSIVE METABOLIC PANEL
ALT: 9 U/L (ref 0–44)
AST: 14 U/L — ABNORMAL LOW (ref 15–41)
Albumin: 4 g/dL (ref 3.5–5.0)
Alkaline Phosphatase: 70 U/L (ref 38–126)
Anion gap: 7 (ref 5–15)
BUN: 7 mg/dL (ref 6–20)
CO2: 29 mmol/L (ref 22–32)
Calcium: 9 mg/dL (ref 8.9–10.3)
Chloride: 100 mmol/L (ref 98–111)
Creatinine, Ser: 1.07 mg/dL (ref 0.61–1.24)
GFR, Estimated: >60 mL/min (ref >60)
Glucose, Bld: 97 mg/dL (ref 70–99)
Potassium: 4.2 mmol/L (ref 3.5–5.1)
Sodium: 136 mmol/L (ref 135–145)
Total Bilirubin: 0.8 mg/dL (ref 0.3–1.2)
Total Protein: 6.6 g/dL (ref 6.5–8.1)

## 2023-03-09 LAB — RAPID URINE DRUG SCREEN, HOSP PERFORMED
Amphetamines: NOT DETECTED
Barbiturates: NOT DETECTED
Benzodiazepines: NOT DETECTED
Cocaine: POSITIVE — AB
Opiates: NOT DETECTED
Tetrahydrocannabinol: NOT DETECTED

## 2023-03-09 LAB — ACETAMINOPHEN LEVEL: Acetaminophen (Tylenol), Serum: <10 ug/mL — ABNORMAL LOW (ref 10–30)

## 2023-03-09 LAB — SALICYLATE LEVEL: Salicylate Lvl: <7 mg/dL — ABNORMAL LOW (ref 7.0–30.0)

## 2023-03-09 LAB — ETHANOL: Alcohol, Ethyl (B): <10 mg/dL (ref <10)

## 2023-03-09 MED ORDER — HYDROXYZINE HCL 25 MG PO TABS: 25.0000 mg | ORAL_TABLET | Freq: Three times a day (TID) | ORAL | Status: DC | PRN

## 2023-03-09 MED ORDER — MAGNESIUM HYDROXIDE 400 MG/5ML PO SUSP: 30.0000 mL | Freq: Every day | ORAL | Status: DC | PRN

## 2023-03-09 MED ORDER — NICOTINE 21 MG/24HR TD PT24
21.0000 mg | MEDICATED_PATCH | Freq: Every day | TRANSDERMAL | Status: DC
  Administered 2023-03-10 – 2023-03-11 (×2): 21 mg via TRANSDERMAL
  Filled 2023-03-09 (×2): qty 1

## 2023-03-09 MED ORDER — ALUM & MAG HYDROXIDE-SIMETH 200-200-20 MG/5ML PO SUSP: 30.0000 mL | ORAL | Status: DC | PRN

## 2023-03-09 MED ORDER — ACETAMINOPHEN 325 MG PO TABS: 650.0000 mg | ORAL_TABLET | Freq: Four times a day (QID) | ORAL | Status: DC | PRN

## 2023-03-09 MED ORDER — TRAZODONE HCL 50 MG PO TABS: 50.0000 mg | ORAL_TABLET | Freq: Every evening | ORAL | Status: DC | PRN

## 2023-03-09 NOTE — ED Notes (Addendum)
Pt transferred from [facility] to [facility] requesting detox from crack cocaine. Pt states, "I was clean for over 20 yrs and started back up again. Now, I can't stop". Pt reports last use was 1 day ago. Denies withdrawal sx at present. Calm, cooperative but sad throughout interview process. Pt denies SI/HI/AVH. Support and encouragement provided. Skin assessment completed. Oriented to unit. Meal and drink offered. Pt verbally contract for safety. Will monitor for safety.

## 2023-03-09 NOTE — Consult Note (Cosign Needed Addendum)
[facility] ED ASSESSMENT   Reason for Consult:  Suicidal Ideations Referring Physician:  Darl Householder PA Patient Identification: Mason Zuniga MRN:  [id number] ED Chief Complaint: Suicidal ideation  Diagnosis:  Principal Problem:   Suicidal ideation Active Problems:   Cocaine abuse [facility])   ED Assessment Time Calculation: Start Time: [id number] Stop Time: 1115 Total Time in Minutes (Assessment Completion): 32   Subjective:   Mason Zuniga is a 52 y.o. male presents to [facility] emergency department reporting suicidal ideations.  Currently denying plan or intent.  He reports a history of bipolar disorder and major depressive disorder.  States he has been down on his "lucky" stating he recently relapsed after being sober for 20 years.  States he has been using cocaine main on weekends.  Reports he is homeless and has been residing in his SUV.  Patient was evaluated by this provider 1 month prior and provided with additional outpatient resources.  He reports, " I tried to get in but nobody will take me or they want to send me far away."  Denied that he is followed by therapy or psychiatry currently.  Denied that he is prescribed any psychotropic medications.  Reports he was on mental health medications for his bipolar disorder 20+ years ago and is unable to recall the names of the medications currently.  I need help with my mind in my mood and my substance abuse.  Denies auditory or visual hallucinations.  Will consult with [facility]) doctor's for inpatient admission.   During evaluation Mason Zuniga is resting in bed; he is alert/oriented x 4; calm/cooperative; and mood congruent with affect.  Patient is speaking in a clear tone at moderate volume, and normal pace; with good eye contact. his thought process is coherent and relevant; There is no indication that he is currently responding to internal/external stimuli or experiencing delusional thought content.  Patient denies homicidal ideation,  psychosis, and paranoia.  uDS + Cocaine.  Patient has remained calm throughout assessment and has answered questions appropriately.    HPI:  Per admission assessment note: "Mason Zuniga is a 52 y.o. male with past medical history of substance use disorder and bipolar disorder who presents to the ED complaining of relapse into using crack cocaine as well as suicidal ideation.  Patient states that until a year and a half ago he was clean for 20 years but due to stressors out of his control he started using again.  He reports that he uses approximately every weekend.  He denies associated alcohol use.  He reports that he was previously diagnosed with bipolar disorder and has been admitted to a psychiatric facility before but it has been several years since his admission and since he has been on psychiatric medications... "  Past Psychiatric History:   Risk to Self or Others: Is the patient at risk to self? No Has the patient been a risk to self in the past 6 months? No Has the patient been a risk to self within the distant past? No Is the patient a risk to others? No Has the patient been a risk to others in the past 6 months? Yes Has the patient been a risk to others within the distant past? No  [place] Scale:  [place] ED from 03/09/2023 in [facility] Department at [facility] ED from 01/31/2023 in [facility] ED from 02/02/2022 in [facility] Emergency Department at [place] CATEGORY High Risk  No Risk No Risk       AIMS:  , , ,  ,   ASAM:    Substance Abuse:     Past Medical History:  Past Medical History:  Diagnosis Date   Asthma    Depression    Hypertension    Witnessed apneic spells 03/20/2020    Past Surgical History:  Procedure Laterality Date   HERNIA REPAIR     Family History:  Family History  Problem Relation Age of Onset   Hypertension Mother    Heart attack Father    Family Psychiatric   History:  Social History:  Social History   Substance and Sexual Activity  Alcohol Use Not Currently     Social History   Substance and Sexual Activity  Drug Use Not Currently    Social History   Socioeconomic History   Marital status: Married    Spouse name: Not on file   Number of children: Not on file   Years of education: Not on file   Highest education level: Not on file  Occupational History   Not on file  Tobacco Use   Smoking status: Every Day    Packs/day: 0.50    Types: Cigarettes    Start date: 1985   Smokeless tobacco: Never   Tobacco comments:    Currently smoking 1ppd as of 04/13/22 ep. Quit for 17 years and then started back about 2 years ago  Vaping Use   Vaping Use: Never used  Substance and Sexual Activity   Alcohol use: Not Currently   Drug use: Not Currently   Sexual activity: Not on file  Other Topics Concern   Not on file  Social History Narrative   Not on file   Social Determinants of Health   Financial Resource Strain: Not on file  Food Insecurity: Not on file  Transportation Needs: Not on file  Physical Activity: Not on file  Stress: Not on file  Social Connections: Not on file   Additional Social History:    Allergies:   Allergies  Allergen Reactions   Zoloft [Sertraline]     Labs:  Results for orders placed or performed during the hospital encounter of 03/09/23 (from the past 48 hour(s))  Resp panel by RT-PCR (RSV, Flu A&B, Covid) Anterior Nasal Swab     Status: None   Collection Time: 03/09/23  7:37 AM   Specimen: Anterior Nasal Swab  Result Value Ref Range   SARS Coronavirus 2 by RT PCR NEGATIVE NEGATIVE    Comment: (NOTE) SARS-CoV-2 target nucleic acids are NOT DETECTED.  The SARS-CoV-2 RNA is generally detectable in upper respiratory specimens during the acute phase of infection. The lowest concentration of SARS-CoV-2 viral copies this assay can detect is 138 copies/mL. A negative result does not preclude  SARS-Cov-2 infection and should not be used as the sole basis for treatment or other patient management decisions. A negative result may occur with  improper specimen collection/handling, submission of specimen other than nasopharyngeal swab, presence of viral mutation(s) within the areas targeted by this assay, and inadequate number of viral copies(<138 copies/mL). A negative result must be combined with clinical observations, patient history, and epidemiological information. The expected result is Negative.  Fact Sheet for Patients:  EntrepreneurPulse.com.au  Fact Sheet for Healthcare Providers:  IncredibleEmployment.be  This test is no t yet approved or cleared by the [place] FDA and  has been authorized for detection and/or diagnosis of SARS-CoV-2 by FDA under an Emergency Use  Authorization (EUA). This EUA will remain  in effect (meaning this test can be used) for the duration of the COVID-19 declaration under Section 564(b)(1) of the Act, 21 U.S.C.section 360bbb-3(b)(1), unless the authorization is terminated  or revoked sooner.       Influenza A by PCR NEGATIVE NEGATIVE   Influenza B by PCR NEGATIVE NEGATIVE    Comment: (NOTE) The Xpert Xpress SARS-CoV-2/FLU/RSV plus assay is intended as an aid in the diagnosis of influenza from Nasopharyngeal swab specimens and should not be used as a sole basis for treatment. Nasal washings and aspirates are unacceptable for Xpert Xpress SARS-CoV-2/FLU/RSV testing.  Fact Sheet for Patients: EntrepreneurPulse.com.au  Fact Sheet for Healthcare Providers: IncredibleEmployment.be  This test is not yet approved or cleared by the [place] FDA and has been authorized for detection and/or diagnosis of SARS-CoV-2 by FDA under an Emergency Use Authorization (EUA). This EUA will remain in effect (meaning this test can be used) for the duration of the COVID-19  declaration under Section 564(b)(1) of the Act, 21 U.S.C. section 360bbb-3(b)(1), unless the authorization is terminated or revoked.     Resp Syncytial Virus by PCR NEGATIVE NEGATIVE    Comment: (NOTE) Fact Sheet for Patients: EntrepreneurPulse.com.au  Fact Sheet for Healthcare Providers: IncredibleEmployment.be  This test is not yet approved or cleared by the [place] FDA and has been authorized for detection and/or diagnosis of SARS-CoV-2 by FDA under an Emergency Use Authorization (EUA). This EUA will remain in effect (meaning this test can be used) for the duration of the COVID-19 declaration under Section 564(b)(1) of the Act, 21 U.S.C. section 360bbb-3(b)(1), unless the authorization is terminated or revoked.  Performed at [facility], [place] [address], [place], [place] [address]   Comprehensive metabolic panel     Status: Abnormal   Collection Time: 03/09/23  8:19 AM  Result Value Ref Range   Sodium 136 135 - 145 mmol/L   Potassium 4.2 3.5 - 5.1 mmol/L   Chloride 100 98 - 111 mmol/L   CO2 29 22 - 32 mmol/L   Glucose, Bld 97 70 - 99 mg/dL    Comment: Glucose reference range applies only to samples taken after fasting for at least 8 hours.   BUN 7 6 - 20 mg/dL   Creatinine, Ser 1.07 0.61 - 1.24 mg/dL   Calcium 9.0 8.9 - 10.3 mg/dL   Total Protein 6.6 6.5 - 8.1 g/dL   Albumin 4.0 3.5 - 5.0 g/dL   AST 14 (L) 15 - 41 U/L   ALT 9 0 - 44 U/L   Alkaline Phosphatase 70 38 - 126 U/L   Total Bilirubin 0.8 0.3 - 1.2 mg/dL   GFR, Estimated >60 >60 mL/min    Comment: (NOTE) Calculated using the CKD-EPI Creatinine Equation (2021)    Anion gap 7 5 - 15    Comment: Performed at [facility], [place] [address] [place], [place] [address]  Ethanol     Status: None   Collection Time: 03/09/23  8:19 AM  Result Value Ref Range   Alcohol, Ethyl (B) <10 <10 mg/dL    Comment: (NOTE) Lowest detectable limit  for serum alcohol is 10 mg/dL.  For medical purposes only. Performed at [facility], [place] [address] [place], [place] [id number]   Salicylate level     Status: Abnormal   Collection Time: 03/09/23  8:19 AM  Result Value Ref Range   Salicylate Lvl Q000111Q (L) 7.0 - 30.0 mg/dL  Comment: Performed at [facility], [place] [address], [place], [place] [address]  Acetaminophen level     Status: Abnormal   Collection Time: 03/09/23  8:19 AM  Result Value Ref Range   Acetaminophen (Tylenol), Serum <10 (L) 10 - 30 ug/mL    Comment: (NOTE) Therapeutic concentrations vary significantly. A range of 10-30 ug/mL  may be an effective concentration for many patients. However, some  are best treated at concentrations outside of this range. Acetaminophen concentrations >150 ug/mL at 4 hours after ingestion  and >50 ug/mL at 12 hours after ingestion are often associated with  toxic reactions.  Performed at [facility], [place] [address], [place], [place] [address]   cbc     Status: None   Collection Time: 03/09/23  8:19 AM  Result Value Ref Range   WBC 7.9 4.0 - 10.5 K/uL   RBC 5.26 4.22 - 5.81 MIL/uL   Hemoglobin 15.3 13.0 - 17.0 g/dL   HCT 46.3 39.0 - 52.0 %   MCV 88.0 80.0 - 100.0 fL   MCH 29.1 26.0 - 34.0 pg   MCHC 33.0 30.0 - 36.0 g/dL   RDW 12.9 11.5 - 15.5 %   Platelets 179 150 - 400 K/uL   nRBC 0.0 0.0 - 0.2 %    Comment: Performed at [facility], [place] [address] [place], [place] [address]  Rapid urine drug screen (hospital performed)     Status: Abnormal   Collection Time: 03/09/23  8:47 AM  Result Value Ref Range   Opiates NONE DETECTED NONE DETECTED   Cocaine POSITIVE (A) NONE DETECTED   Benzodiazepines NONE DETECTED NONE DETECTED   Amphetamines NONE DETECTED NONE DETECTED   Tetrahydrocannabinol NONE DETECTED NONE DETECTED   Barbiturates NONE DETECTED NONE DETECTED    Comment: (NOTE) DRUG SCREEN FOR  MEDICAL PURPOSES ONLY.  IF CONFIRMATION IS NEEDED FOR ANY PURPOSE, NOTIFY LAB WITHIN 5 DAYS.  LOWEST DETECTABLE LIMITS FOR URINE DRUG SCREEN Drug Class                     Cutoff (ng/mL) Amphetamine and metabolites    1000 Barbiturate and metabolites    200 Benzodiazepine                 200 Opiates and metabolites        300 Cocaine and metabolites        300 THC                            50 Performed at [facility], [place] [address] [place], [place] [address]     No current facility-administered medications for this encounter.   Current Outpatient Medications  Medication Sig Dispense Refill   albuterol (VENTOLIN HFA) 108 (90 Base) MCG/ACT inhaler Inhale 1-2 puffs into the lungs every 6 (six) hours as needed for wheezing or shortness of breath. 18 g 0    Musculoskeletal: Strength & Muscle Tone: within normal limits Gait & Station: normal Patient leans: N/A   Psychiatric Specialty Exam: Presentation  General Appearance:  Appropriate for Environment  Eye Contact: Good  Speech: Clear and Coherent  Speech Volume: Normal  Handedness: Right   Mood and Affect  Mood: Depressed  Affect: Congruent   Thought Process  Thought Processes: Coherent  Descriptions of Associations:Intact  Orientation:Full (Time, Place and Person)  Thought Content:Logical  History of Schizophrenia/Schizoaffective disorder:No data recorded Duration  of Psychotic Symptoms:No data recorded Hallucinations:Hallucinations: None  Ideas of Reference:None  Suicidal Thoughts:Suicidal Thoughts: Yes, Passive SI Passive Intent and/or Plan: Without Intent; Without Plan  Homicidal Thoughts:Homicidal Thoughts: No   Sensorium  Memory: Immediate Fair; Recent Fair; Remote Fair  Judgment: Fair  Insight: Good   Executive Functions  Concentration: Good  Attention Span: Fair  Recall: Good  Fund of Knowledge: Good  Language: Good   Psychomotor  Activity  Psychomotor Activity:Psychomotor Activity: Normal   Assets  Assets: Desire for Improvement; Social Support    Sleep  Sleep:Sleep: Fair   Physical Exam: Physical Exam Vitals and nursing note reviewed.  Cardiovascular:     Rate and Rhythm: Normal rate and regular rhythm.  Pulmonary:     Effort: Pulmonary effort is normal.     Breath sounds: Normal breath sounds.  Musculoskeletal:     Cervical back: Normal range of motion.  Neurological:     Mental Status: He is alert and oriented to person, place, and time.  Psychiatric:        Mood and Affect: Mood normal.        Behavior: Behavior normal.        Thought Content: Thought content normal.    Review of Systems  Psychiatric/Behavioral:  Positive for depression and substance abuse. Suicidal ideas: passive ideations.The patient is nervous/anxious.   All other systems reviewed and are negative.  Blood pressure 120/66, pulse (!) 58, temperature 97.6 F (36.4 C), temperature source Oral, resp. rate 15, height '5\' 11"'$  (1.803 m), weight 77.1 kg, SpO2 100 %. Body mass index is 23.71 kg/m.  Medical Decision Making:  Problem 1: Cocaine abuse/use  Problem 2: Substance Induced Mood Disorder    Disposition:  Patient to be considered at facility based crisis  (FBC)  Derrill [place], NP 03/09/2023 11:41 AM

## 2023-03-09 NOTE — ED Notes (Signed)
Safe transport call to transport patient to [facility].

## 2023-03-09 NOTE — ED Provider Notes (Signed)
Pt has been accepted to facility based crisis center.  He is stable for transport via safe transport.   Isla Pence, MD 03/09/23 1329

## 2023-03-09 NOTE — ED Provider Notes (Signed)
[facility] Provider Note   CSN: [id number] Arrival date & time: 03/09/23  [id number]     History  Chief Complaint  Patient presents with   Drug Problem   Suicidal    Mason Zuniga is a 52 y.o. male with past medical history of substance use disorder and bipolar disorder who presents to the ED complaining of relapse into using crack cocaine as well as suicidal ideation.  Patient states that until a year and a half ago he was clean for 20 years but due to stressors out of his control he started using again.  He reports that he uses approximately every weekend.  He denies associated alcohol use.  He reports that he was previously diagnosed with bipolar disorder and has been admitted to a psychiatric facility before but it has been several years since his admission and since he has been on psychiatric medications.  He reports that he is feeling suicidal because he feels hopeless due to currently relapsing with crack cocaine and said he feels like he is disappointed in himself and his family.  He states that he thinks of overdosing but does not have a definitive suicide plan.  He notes that he has been depressed since relapsing.  He denies any recent manic episodes.  He denies HI or AVH. He denies chest pain, shortness of breath, abdominal pain, nausea, vomiting, fever, or other somatic complaints on my initial exam.      Home Medications No regular prescription medication  Allergies    Zoloft [sertraline]    Review of Systems   Review of Systems  All other systems reviewed and are negative.   Physical Exam Updated Vital Signs BP 120/66 (BP Location: Right Arm)   Pulse (!) 58   Temp 97.6 F (36.4 C) (Oral)   Resp 15   Ht '5\' 11"'$  (1.803 m)   Wt 77.1 kg   SpO2 100%   BMI 23.71 kg/m  Physical Exam Vitals and nursing note reviewed.  Constitutional:      General: He is not in acute distress.    Appearance: Normal appearance. He is not  ill-appearing, toxic-appearing or diaphoretic.  HENT:     Head: Normocephalic and atraumatic.     Mouth/Throat:     Mouth: Mucous membranes are moist.  Eyes:     General: No scleral icterus.    Conjunctiva/sclera: Conjunctivae normal.  Cardiovascular:     Rate and Rhythm: Normal rate and regular rhythm.     Heart sounds: No murmur heard. Pulmonary:     Effort: Pulmonary effort is normal. No respiratory distress.     Breath sounds: Normal breath sounds. No stridor. No wheezing, rhonchi or rales.  Abdominal:     General: Abdomen is flat. There is no distension.     Palpations: Abdomen is soft.     Tenderness: There is no abdominal tenderness. There is no guarding or rebound.  Musculoskeletal:        General: Normal range of motion.     Cervical back: Normal range of motion and neck supple.     Right lower leg: No edema.     Left lower leg: No edema.  Skin:    General: Skin is warm and dry.     Capillary Refill: Capillary refill takes less than 2 seconds.  Neurological:     General: No focal deficit present.     Mental Status: He is alert and oriented to person, place, and time.  Psychiatric:        Attention and Perception: Attention normal. He does not perceive auditory or visual hallucinations.        Mood and Affect: Mood is anxious and depressed. Affect is flat and tearful.        Speech: He is communicative. Speech is delayed. Speech is not rapid and pressured, slurred or tangential.        Behavior: Behavior is slowed and withdrawn. Behavior is not agitated, aggressive, hyperactive or combative. Behavior is cooperative.        Thought Content: Thought content is not paranoid or delusional. Thought content includes suicidal ideation. Thought content does not include homicidal ideation. Thought content includes suicidal plan. Thought content does not include homicidal plan.     ED Results / Procedures / Treatments   Labs (all labs ordered are listed, but only abnormal  results are displayed) Labs Reviewed  COMPREHENSIVE METABOLIC PANEL - Abnormal; Notable for the following components:      Result Value   AST 14 (*)    All other components within normal limits  SALICYLATE LEVEL - Abnormal; Notable for the following components:   Salicylate Lvl Q000111Q (*)    All other components within normal limits  ACETAMINOPHEN LEVEL - Abnormal; Notable for the following components:   Acetaminophen (Tylenol), Serum <10 (*)    All other components within normal limits  RAPID URINE DRUG SCREEN, HOSP PERFORMED - Abnormal; Notable for the following components:   Cocaine POSITIVE (*)    All other components within normal limits  RESP PANEL BY RT-PCR (RSV, FLU A&B, COVID)  [id number]  ETHANOL  CBC    EKG Mild sinus bradycardia at 54 bpm, no acute ST-T changes, EKG similar to previous  Radiology No results found.  Procedures Procedures    Medications Ordered in ED Medications - No data to display  ED Course/ Medical Decision Making/ A&P                             Medical Decision Making Amount and/or Complexity of Data Reviewed Labs: ordered. Decision-making details documented in ED Course. ECG/medicine tests: ordered. Decision-making details documented in ED Course.   Medical Decision Making:   Mason Zuniga is a 52 y.o. male who presented to the ED today with suicidal ideation detailed above.    Additional history discussed with patient's family/caregivers.  Patient's presentation is complicated by their history of cocaine use, untreated bipolar disorder.  Complete initial physical exam performed, notably the patient was with depressed and flat affect expressing suicidal ideation. He denied homicidal ideation or hallucinations.  He was tearful but calm and cooperative.  Heart, lung, and abdominal exam benign.  Patient alert and oriented and neurologically intact. No meningismus.  Reviewed and confirmed nursing documentation for past medical history, family  history, social history.    Initial Assessment:   With the patient's presentation of suicidal ideation, most likely diagnosis is decompensated bipolar disorder complicated by cocaine use. Differential diagnosis includes but is not limited to major depressive disorder, schizoaffective disorder, schizophrenia, polysubstance abuse, acute intoxication, metabolic disturbance, viral syndrome, electrolyte disturbance.  This is most consistent with an acute complicated illness  Initial Plan:  Screening labs including CBC and Metabolic panel to evaluate for infectious or metabolic etiology of disease.  Acetaminophen, salicylate, and ethanol levels  Viral swabs  EKG to evaluate for cardiac pathology UDS At patient's request, his wife was updated on the phone following  initial assessment.  She is aware that workup so far is pending but plan is to have mental health team see the patient pending medical clearance. Objective evaluation as reviewed   Initial Study Results:   Laboratory  All laboratory results reviewed without evidence of clinically relevant pathology.   Exceptions include: UDS positive for cocaine, otherwise unremarkable workup  EKG EKG was reviewed independently. ST segments without concerns for elevations.   EKG: Mild sinus bradycardia at 54 bpm, no acute ST-T changes, EKG similar to previous.   Final Assessment and Plan:   This is a 52 year old male presenting to the ED with suicidal ideation.  Patient reports relapsing into cocaine use about 1-1/2 years ago and this expresses hopelessness regarding current situation as he struggles with substance abuse.  He has been in both residential substance abuse treatment and had prior psychiatric hospitalizations but has been multiple years.  He reports a history of bipolar disorder but again has been off medications for many years.  He denies recent manic episodes.  He denies homicidal ideation or hallucinations.  He denies somatic complaints  today.  Workup obtained as above for medical clearance. He does have flat affect and is tearful expressing suicidal ideation on exam but otherwise physical exam unremarkable.  UDS positive for cocaine.  No other substances or alcohol on board.  Patient has stable vital signs and is wanting to seek treatment as IVC not required at this time.  At [phone], patient medically cleared and stable for psychiatric disposition. TTS consult placed and pt disposition to be determined by mental health team.    Clinical Impression:  1. Suicidal ideation   2. Cocaine abuse ([place])      Data Unavailable           Final Clinical Impression(s) / ED Diagnoses Final diagnoses:  Cocaine abuse ([place])  Suicidal ideation    Rx / DC Orders ED Discharge Orders     None         Turner Daniels 03/09/23 1238    Isla Pence, MD 03/09/23 1520

## 2023-03-09 NOTE — ED Notes (Signed)
Pt sleeping in no acute distress. RR even and unlabored. Environment secured. Will continue to monitor for safety. 

## 2023-03-09 NOTE — ED Notes (Signed)
Pt is currently sleeping, no distress noted, environmental check complete, will continue to monitor patient for safety. ? ?

## 2023-03-09 NOTE — ED Provider Notes (Signed)
Facility Based Crisis Admission H&P  Date: 03/09/23 Patient Name: Mason Zuniga MRN: [id number] Chief Complaint: "I need rehab".  Diagnoses:  Final diagnoses:  Cocaine abuse [facility])    HPI: Patient initially presented to [facility] emergency department reporting suicidal ideations.  In addition he reports a relapse on crack cocaine.  He was recommended for treatment in the facility base crisis unit.  Mason Zuniga, 52 y.o., male patient seen face to face by this provider and chart reviewed on 03/09/23.   Patient reports a history of bipolar disorder and MDD.  He is homeless and has been living in his SUV.  Reports he does not take any medications at this time.  UDS positive for cocaine.  EtOH negative  Patient reports he was sober for 20 years and had a relapse roughly 1.5 years ago.  Since that time he has been using crack cocaine regularly.  He denies any alcohol use.  He denies any alcohol withdrawal seizures or delirium tremens.  He is seeking residential substance abuse treatment.  During evaluation [name] is observed laying in his bed asleep.  He is easily awakened.  He is alert/oriented x 4, cooperative, and attentive.  He has normal speech and behavior.  He endorses an increase in his depression and anxiety and contributes this to his substance use.  Reports, "it is just out of control".  He appears anxious.  He endorsed suicidal ideations while in the emergency department.  However he is denying SI/HI/AVH at this time.  He denies access to firearms/weapons. Objectively there is no evidence of psychosis/mania or delusional thinking.  Patient is able to converse coherently, goal directed thoughts, no distractibility, or pre-occupation.   Patient answered question appropriately.    Discussed the milieu and expectations.  Patient is in agreement.  PHQ 2-9:   [place] ED from 03/09/2023 in [facility] Most recent reading at 03/09/2023  2:51 PM ED from  03/09/2023 in [facility] Emergency Department at [facility] Most recent reading at 03/09/2023  6:40 AM ED from 01/31/2023 in [facility] Most recent reading at 01/31/2023  3:25 PM  C-SSRS RISK CATEGORY No Risk High Risk No Risk        Total Time spent with patient: 30 minutes  Musculoskeletal  Strength & Muscle Tone: within normal limits Gait & Station: normal Patient leans: N/A  Psychiatric Specialty Exam  Presentation General Appearance:  Appropriate for Environment  Eye Contact: Good  Speech: Clear and Coherent; Normal Rate  Speech Volume: Normal  Handedness: Right   Mood and Affect  Mood: Depressed  Affect: Congruent   Thought Process  Thought Processes: Coherent  Descriptions of Associations:Intact  Orientation:Full (Time, Place and Person)  Thought Content:Logical    Hallucinations:Hallucinations: None  Ideas of Reference:None  Suicidal Thoughts:Suicidal Thoughts: No SI Passive Intent and/or Plan: Without Intent; Without Plan  Homicidal Thoughts:Homicidal Thoughts: No   Sensorium  Memory: Immediate Good; Recent Good; Remote Good  Judgment: Fair  Insight: Fair   Community education officer  Concentration: Good  Attention Span: Good  Recall: Good  Fund of Knowledge: Good  Language: Good   Psychomotor Activity  Psychomotor Activity: Psychomotor Activity: Normal   Assets  Assets: Desire for Improvement; Leisure Time; Physical Health; Resilience   Sleep  Sleep: Sleep: Fair   Nutritional Assessment (For OBS and FBC admissions only) Has the patient had a weight loss or gain of 10 pounds or more in the last 3 months?: No Has  the patient had a decrease in food intake/or appetite?: No Does the patient have dental problems?: No Does the patient have eating habits or behaviors that may be indicators of an eating disorder including binging or inducing vomiting?: No Has the patient recently  lost weight without trying?: 0 Has the patient been eating poorly because of a decreased appetite?: 0 Malnutrition Screening Tool Score: 0    Physical Exam Vitals and nursing note reviewed.  Constitutional:      General: He is not in acute distress.    Appearance: He is well-developed.  HENT:     Head: Normocephalic and atraumatic.  Eyes:     General:        Right eye: No discharge.        Left eye: No discharge.     Conjunctiva/sclera: Conjunctivae normal.  Cardiovascular:     Rate and Rhythm: Normal rate and regular rhythm.     Heart sounds: No murmur heard. Pulmonary:     Effort: Pulmonary effort is normal. No respiratory distress.     Breath sounds: Normal breath sounds.  Abdominal:     Palpations: Abdomen is soft.     Tenderness: There is no abdominal tenderness.  Musculoskeletal:        General: No swelling. Normal range of motion.     Cervical back: Normal range of motion and neck supple.  Skin:    General: Skin is warm and dry.     Capillary Refill: Capillary refill takes less than 2 seconds.     Coloration: Skin is not jaundiced or pale.  Neurological:     Mental Status: He is alert and oriented to person, place, and time.  Psychiatric:        Attention and Perception: Attention and perception normal.        Mood and Affect: Mood is anxious and depressed.        Speech: Speech normal.        Behavior: Behavior normal.        Thought Content: Thought content normal.        Cognition and Memory: Cognition normal.        Judgment: Judgment normal.    Review of Systems  Constitutional: Negative.   HENT: Negative.    Eyes: Negative.   Respiratory: Negative.    Cardiovascular: Negative.   Musculoskeletal: Negative.   Skin: Negative.   Neurological: Negative.   Psychiatric/Behavioral:  Positive for depression and substance abuse. The patient is nervous/anxious.     Blood pressure 129/78, pulse 89, temperature 98.1 F (36.7 C), temperature source Oral,  resp. rate 18, SpO2 100 %. There is no height or weight on file to calculate BMI.  Past Psychiatric History: Patient reports-bipolar disorder, MDD, polysubstance abuse  Is the patient at risk to self? No  Has the patient been a risk to self in the past 6 months? No .    Has the patient been a risk to self within the distant past? No   Is the patient a risk to others? No   Has the patient been a risk to others in the past 6 months? No   Has the patient been a risk to others within the distant past? No   Past Medical History:  Past Medical History:  Diagnosis Date   Asthma    Depression    Hypertension    Witnessed apneic spells 03/20/2020    Family History:  Mother-hypertension, Father-heart attack, denies any psychiatric history. Social History:  Not married, homeless Regular cocaine use.  Last Labs:  Admission on 03/09/2023, Discharged on 03/09/2023  Component Date Value Ref Range Status   Sodium 03/09/2023 136  135 - 145 mmol/L Final   Potassium 03/09/2023 4.2  3.5 - 5.1 mmol/L Final   Chloride 03/09/2023 100  98 - 111 mmol/L Final   CO2 03/09/2023 29  22 - 32 mmol/L Final   Glucose, Bld 03/09/2023 97  70 - 99 mg/dL Final   Glucose reference range applies only to samples taken after fasting for at least 8 hours.   BUN 03/09/2023 7  6 - 20 mg/dL Final   Creatinine, Ser 03/09/2023 1.07  0.61 - 1.24 mg/dL Final   Calcium 03/09/2023 9.0  8.9 - 10.3 mg/dL Final   Total Protein 03/09/2023 6.6  6.5 - 8.1 g/dL Final   Albumin 03/09/2023 4.0  3.5 - 5.0 g/dL Final   AST 03/09/2023 14 (L)  15 - 41 U/L Final   ALT 03/09/2023 9  0 - 44 U/L Final   Alkaline Phosphatase 03/09/2023 70  38 - 126 U/L Final   Total Bilirubin 03/09/2023 0.8  0.3 - 1.2 mg/dL Final   GFR, Estimated 03/09/2023 >60  >60 mL/min Final   Comment: (NOTE) Calculated using the CKD-EPI Creatinine Equation (2021)    Anion gap 03/09/2023 7  5 - 15 Final   Performed at [facility], [place]  [address] [place], [place] [address]   Alcohol, Ethyl (B) 03/09/2023 <10  <10 mg/dL Final   Comment: (NOTE) Lowest detectable limit for serum alcohol is 10 mg/dL.  For medical purposes only. Performed at [facility] [address] [place], [place] [id number]    Salicylate Lvl [id number] <7.0 (L)  7.0 - 30.0 mg/dL Final   Performed at [place] [address] [place], [place] [address]   Acetaminophen (Tylenol), Serum 03/09/2023 <10 (L)  10 - 30 ug/mL Final   Comment: (NOTE) Therapeutic concentrations vary significantly. A range of 10-30 ug/mL  may be an effective concentration for many patients. However, some  are best treated at concentrations outside of this range. Acetaminophen concentrations >150 ug/mL at 4 hours after ingestion  and >50 ug/mL at 12 hours after ingestion are often associated with  toxic reactions.  Performed at [facility], [place] [address] [place], [place] [address]    WBC 03/09/2023 7.9  4.0 - 10.5 K/uL Final   RBC 03/09/2023 5.26  4.22 - 5.81 MIL/uL Final   Hemoglobin 03/09/2023 15.3  13.0 - 17.0 g/dL Final   HCT 03/09/2023 46.3  39.0 - 52.0 % Final   MCV 03/09/2023 88.0  80.0 - 100.0 fL Final   MCH 03/09/2023 29.1  26.0 - 34.0 pg Final   MCHC 03/09/2023 33.0  30.0 - 36.0 g/dL Final   RDW 03/09/2023 12.9  11.5 - 15.5 % Final   Platelets 03/09/2023 179  150 - 400 K/uL Final   nRBC 03/09/2023 0.0  0.0 - 0.2 % Final   Performed at [facility], [place] [address] [place], [place] [address]   Opiates 03/09/2023 NONE DETECTED  NONE DETECTED Final   Cocaine 03/09/2023 POSITIVE (A)  NONE DETECTED Final   Benzodiazepines 03/09/2023 NONE DETECTED  NONE DETECTED Final   Amphetamines 03/09/2023 NONE DETECTED  NONE DETECTED Final   Tetrahydrocannabinol 03/09/2023 NONE DETECTED  NONE DETECTED Final   Barbiturates 03/09/2023 NONE DETECTED  NONE DETECTED Final   Comment: (NOTE) DRUG SCREEN  FOR MEDICAL PURPOSES ONLY.  IF CONFIRMATION IS NEEDED FOR ANY PURPOSE, NOTIFY LAB WITHIN 5 DAYS.  LOWEST DETECTABLE LIMITS FOR URINE DRUG SCREEN Drug Class                     Cutoff (ng/mL) Amphetamine and metabolites    1000 Barbiturate and metabolites    200 Benzodiazepine                 200 Opiates and metabolites        300 Cocaine and metabolites        300 THC                            50 Performed at [facility], [place] [address] [place], [place] [address]    SARS Coronavirus 2 by RT PCR 03/09/2023 NEGATIVE  NEGATIVE Final   Comment: (NOTE) SARS-CoV-2 target nucleic acids are NOT DETECTED.  The SARS-CoV-2 RNA is generally detectable in upper respiratory specimens during the acute phase of infection. The lowest concentration of SARS-CoV-2 viral copies this assay can detect is 138 copies/mL. A negative result does not preclude SARS-Cov-2 infection and should not be used as the sole basis for treatment or other patient management decisions. A negative result may occur with  improper specimen collection/handling, submission of specimen other than nasopharyngeal swab, presence of viral mutation(s) within the areas targeted by this assay, and inadequate number of viral copies(<138 copies/mL). A negative result must be combined with clinical observations, patient history, and epidemiological information. The expected result is Negative.  Fact Sheet for Patients:  EntrepreneurPulse.com.au  Fact Sheet for Healthcare Providers:  IncredibleEmployment.be  This test is no                          t yet approved or cleared by the [place] FDA and  has been authorized for detection and/or diagnosis of SARS-CoV-2 by FDA under an Emergency Use Authorization (EUA). This EUA will remain  in effect (meaning this test can be used) for the duration of the COVID-19 declaration under Section 564(b)(1) of the Act,  21 U.S.C.section 360bbb-3(b)(1), unless the authorization is terminated  or revoked sooner.       Influenza A by PCR 03/09/2023 NEGATIVE  NEGATIVE Final   Influenza B by PCR 03/09/2023 NEGATIVE  NEGATIVE Final   Comment: (NOTE) The Xpert Xpress SARS-CoV-2/FLU/RSV plus assay is intended as an aid in the diagnosis of influenza from Nasopharyngeal swab specimens and should not be used as a sole basis for treatment. Nasal washings and aspirates are unacceptable for Xpert Xpress SARS-CoV-2/FLU/RSV testing.  Fact Sheet for Patients: EntrepreneurPulse.com.au  Fact Sheet for Healthcare Providers: IncredibleEmployment.be  This test is not yet approved or cleared by the [place] FDA and has been authorized for detection and/or diagnosis of SARS-CoV-2 by FDA under an Emergency Use Authorization (EUA). This EUA will remain in effect (meaning this test can be used) for the duration of the COVID-19 declaration under Section 564(b)(1) of the Act, 21 U.S.C. section 360bbb-3(b)(1), unless the authorization is terminated or revoked.     Resp Syncytial Virus by PCR 03/09/2023 NEGATIVE  NEGATIVE Final   Comment: (NOTE) Fact Sheet for Patients: EntrepreneurPulse.com.au  Fact Sheet for Healthcare Providers: IncredibleEmployment.be  This test is not yet approved or cleared by the [place] FDA and has been authorized for detection and/or diagnosis of SARS-CoV-2 by  FDA under an Emergency Use Authorization (EUA). This EUA will remain in effect (meaning this test can be used) for the duration of the COVID-19 declaration under Section 564(b)(1) of the Act, 21 U.S.C. section 360bbb-3(b)(1), unless the authorization is terminated or revoked.  Performed at [facility], [place] [address] [place], [place] [address]     Allergies: Zoloft [sertraline]  Medications:  Facility Ordered Medications   Medication   acetaminophen (TYLENOL) tablet 650 mg   alum & mag hydroxide-simeth (MAALOX/MYLANTA) 200-200-20 MG/5ML suspension 30 mL   magnesium hydroxide (MILK OF MAGNESIA) suspension 30 mL   hydrOXYzine (ATARAX) tablet 25 mg   traZODone (DESYREL) tablet 50 mg   [START ON 03/10/2023] nicotine (NICODERM CQ - dosed in mg/24 hours) patch 21 mg   PTA Medications  Medication Sig   albuterol (VENTOLIN HFA) 108 (90 Base) MCG/ACT inhaler Inhale 1-2 puffs into the lungs every 6 (six) hours as needed for wheezing or shortness of breath.    Long Term Goals: Improvement in symptoms so as ready for discharge  Short Term Goals: Patient will verbalize feelings in meetings with treatment team members., Patient will attend at least of 50% of the groups daily., Pt will complete the PHQ9 on admission, day 3 and discharge., Patient will participate in completing the [place], Patient will score a low risk of violence for 24 hours prior to discharge, and Patient will take medications as prescribed daily.  Medical Decision Making  Patient was transferred from the [facility] emergency department after being recommended for facility base crisis unit.  He admits to regular cocaine use and is requesting residential substance abuse treatment.    Recommendations  Based on my evaluation the patient does not appear to have an emergency medical condition.  Admit patient to the facility base crisis unit.   Revonda Humphrey, NP 03/09/23  6:28 PM

## 2023-03-09 NOTE — ED Notes (Signed)
Psych at bedside assessing pt.

## 2023-03-09 NOTE — ED Provider Notes (Cosign Needed Addendum)
The patient is a 52 year old male with a history of cocaine abuse, no history of psychiatric admissions or outpatient psychiatric evaluations.  He presented to the emergency department reporting problems with crack cocaine use and resulting suicidal thoughts.  He was transferred to the facility based crisis for substance use treatment.  On assessment 3/12, the patient exhibits a linear and logical thought process with a depressed affect.  The patient confirms the information above.  He denies active suicidal thoughts and contracts for safety on the unit.  He reports having no home medications, states he has no significant medical issues, and denies any history of withdrawal symptoms.  He reports only using crack cocaine, no other substances.  Full evaluation to be done 3/13.  Lab work unremarkable. UDS positive only for cocaine.  Orders placed.   Corky Sox, MD PGY-2

## 2023-03-09 NOTE — ED Triage Notes (Signed)
Pt reports that he wants to kill himself and has been using crack all weekend. He is tearful in triage. States that he was sober for about 20 years until 2 years ago and is feeling hopeless. He denies other drug use. A&Ox4. Calm and cooperative.

## 2023-03-10 DIAGNOSIS — F1410 Cocaine abuse, uncomplicated: Secondary | ICD-10-CM | POA: Diagnosis not present

## 2023-03-10 NOTE — ED Notes (Signed)
Patient attended group meeting with the nurse

## 2023-03-10 NOTE — ED Notes (Signed)
Patient is currently in the dining room attending AA, no distress noted, will continue to monitor patient for safety

## 2023-03-10 NOTE — ED Notes (Signed)
Patient awake and alert on unit.  He ate breakfast and is calm and cooperative with care.  Patient denies avh shi or plan.  No evidence of withdrawal.  Will monitor.

## 2023-03-10 NOTE — Discharge Planning (Signed)
[employer] insurance and LCSW has sent referrals to the following facilities for review:   [facility] in [place], [place] [facility]   LCSW will follow up on tomorrow for updates.    Lucius Conn, LCSW Clinical Social Worker [facility] Ph: [phone]

## 2023-03-10 NOTE — ED Notes (Signed)
Pt is currently sleeping, no distress noted, environmental check complete, will continue to monitor patient for safety. ? ?

## 2023-03-10 NOTE — Tx Team (Signed)
LCSW met with patient to assess current mood, affect, physical state, and inquire about needs/goals while here in [facility] and after discharge. Patient reports he presented due to needing to seek help with his cocaine use. Patient reports he has been binge using about $300-$500 worth of cocaine every weekend. Patient reports precipitating factors as family issues, relationship issues, difficulty locating housing to due being a registered sex offender, and being frustrated of his continuous use of substances. Patient denies any other substance use or alcohol use. Patient reports he has tried to seek help before, however has not been successful in the past. Patient reports cocaine makes his pain go away but also continuous to cause problems in his relationship. Patient reports he was living with his wife that he has been with for 3 years. However, reports for the last 2 weeks he has been sleeping in his SUV at his job due to his wife being frustrated with him. Patient reports his wife is constantly on him regarding his time arriving back to the house and what he has been doing. Patietn acknowledges that he believes he has given her reason to behave this way. Patient reports an interested in getting clean in order to help his marriage as well as create better relationships with his daughters. Patient reports his current goal is to seek residential placement at this time. Patient reports he is aware that his current status as a registered sex offender may be a hindrance to placement. Patient aware that LCSW will send referrals out for review and will follow up to provide updates as received. Patient expressed understanding and appreciation of LCSW assistance. If residential is not an option, patient is willing to participate in a CDIOP upon discharge. No other needs were reported at this time by patient.   Mason Conn, LCSW Clinical Social Worker [facility] Ph: [phone]
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
## 2023-03-10 NOTE — ED Notes (Signed)
Patient was awake and alert on unit earlier in the day.  He attended group and participated well. He appears to have good insight and is motivated for change.  Patient is now resting in his room after eating lunch.  Will monitor and provide a safe environment.

## 2023-03-11 DIAGNOSIS — F1410 Cocaine abuse, uncomplicated: Secondary | ICD-10-CM

## 2023-03-11 MED ORDER — NICOTINE 21 MG/24HR TD PT24: 21.0000 mg | MEDICATED_PATCH | Freq: Every day | TRANSDERMAL | 0 refills | Status: AC

## 2023-03-11 MED ORDER — HYDROXYZINE HCL 25 MG PO TABS: 25.0000 mg | ORAL_TABLET | Freq: Three times a day (TID) | ORAL | 0 refills | Status: AC | PRN

## 2023-03-11 MED ORDER — TRAZODONE HCL 50 MG PO TABS: 50.0000 mg | ORAL_TABLET | Freq: Every evening | ORAL | 0 refills | Status: AC | PRN

## 2023-03-11 MED ORDER — ALBUTEROL SULFATE HFA 108 (90 BASE) MCG/ACT IN AERS: 1.0000 | INHALATION_SPRAY | Freq: Four times a day (QID) | RESPIRATORY_TRACT | 0 refills | Status: AC | PRN

## 2023-03-11 NOTE — ED Notes (Signed)
Patient awake and alert on unit.  He ate breakfast and is presently making phone calls to secure aftercare.  Patient is calm, quiet, pleasant and organized.  He makes needs known and is without distress or withdrawal.  Patient appears motivated for treatment.  Will monitor and provide safe environment.

## 2023-03-11 NOTE — Discharge Instructions (Addendum)
Patient has been accepted to: [place]: [address] [place], [place] [address] and can admit on tomorrow 03/12/2023 by 3:30pm  [facility] [place], [place], [address] [phone] phone  New Patient Assessment/Therapy Walk-Ins:  Monday and Wednesday: 8 am until slots are full. Every 1st and 2nd Fridays of the month: 1 pm - 5 pm.  NO ASSESSMENT/THERAPY WALK-INS ON [place]  New Patient Assessment/Medication Management Walk-Ins:  Monday - Friday:  8 am - 11 am.  For all walk-ins, we ask that you arrive by 7:30 am because patients will be seen in the order of arrival.  Availability is limited; therefore, you may not be seen on the same day that you walk-in.  Our goal is to serve and meet the needs of our community to the best of our ability.  HALFWAY HOUSES:  [employer] [facility] [place] [phone]  [employer].oxfordvacancies.com  12 STEP PROGRAMS:  Alcoholics Anonymous of [place] ReportZoo.com.cy  Narcotics Anonymous of [place] GreenScrapbooking.dk  Al-Anon of [employer], [place] www.[place]alanon.org/find-meetings.html  Nar-Anon https://nar-anon.org/find-a-meetin

## 2023-03-11 NOTE — Discharge Planning (Signed)
Referral was received by [facility] and phone screening was complete with the patient. Per Maylon Cos, patient has been accepted and can transfer to the facility on tomorrow 03/12/23 by 3:30pm. Address is [address] [place], [place] [address]. Update has been provided to the patient and MD made aware. 30 Scripts needed for admission. Facility aware that wife will transport the patient to the facility on tomorrow. No other needs were reported at this time.    Lucius Conn, LCSW Clinical Social Worker [place] [facility] Ph: [phone]
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
## 2023-03-11 NOTE — ED Notes (Signed)
Pt is currently sleeping, no distress noted, environmental check complete, will continue to monitor patient for safety. ? ?

## 2023-03-11 NOTE — Discharge Planning (Signed)
LCSW spoke with patient on this morning regarding updates. Patient informed LCSW that he really believes it is best that he returns back to work at this time instead of going to treatment. Patient reports he spoke with his job and they reported being behind in work due to him not being there. Patient reports feeling a little pressure regarding being there and being able to keep his job with criminal history. Patient reports he also has a brand new SUV that he has to pay for and reports being in treatment will not help him at this moment. LCSW provided brief counseling to the patient and explored if family could assist patient while in treatment. Patient reports he believes his wife would support him, however he is fearful of losing his job. FMLA paperwork was received by MD. Team will speak with patient to establish plan.   LCSW received updates from the following facilities: [facility]: phone screening has been completed with the patient. They are working on admission and insurance verification, and will follow up to provide updates shortly.  [facility]: Patient has a $4000 deductible that would need to be paid upfront. There is bed availability at [facility] in [place]. LCSW will have discussion with patient.    LCSW will continue to follow up to provide updates.   Lucius Conn, LCSW Clinical Social Worker [facility] Ph: [phone]

## 2023-05-13 IMAGING — DX DG CHEST 1V PORT
2 series · 2 of 2 positions shown · non-contrast
Comparison: 02/02/2022

CLINICAL DATA: Shortness of breath, chest pain

EXAM:
PORTABLE CHEST 1 VIEW

[chest ap (1 of 2)]
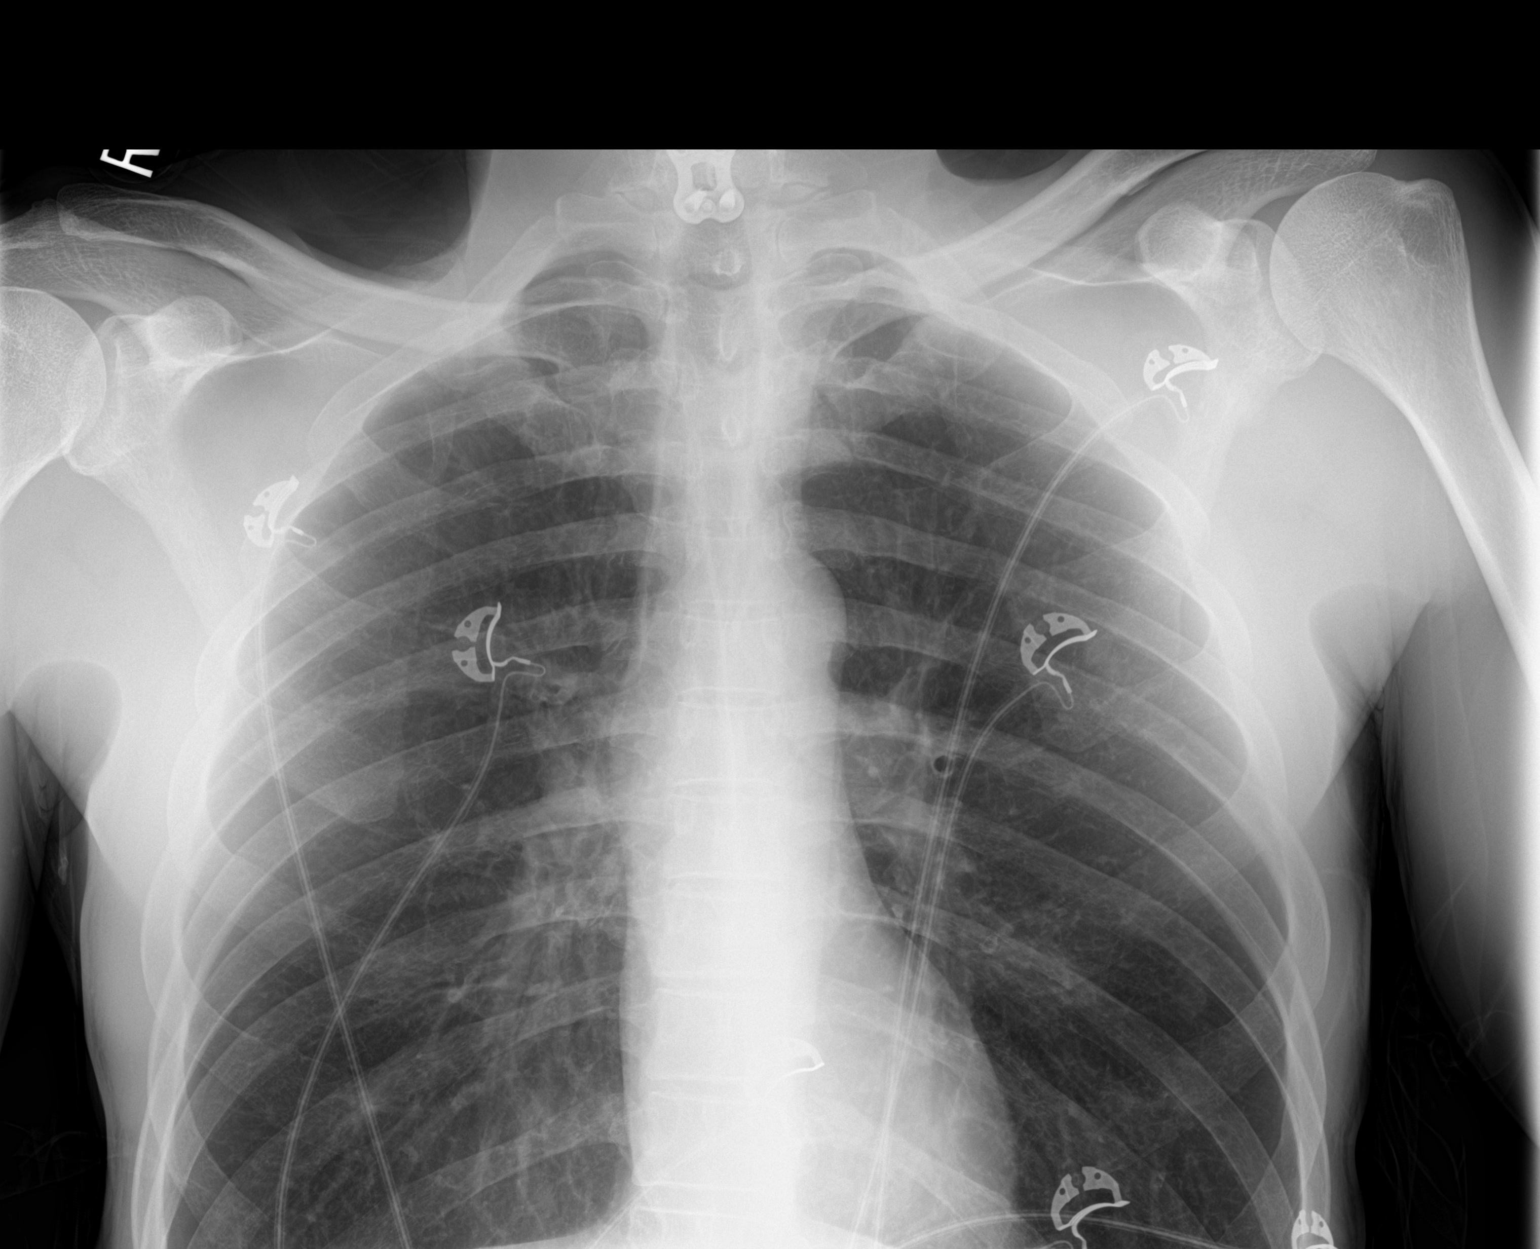

[chest ap (2 of 2)]
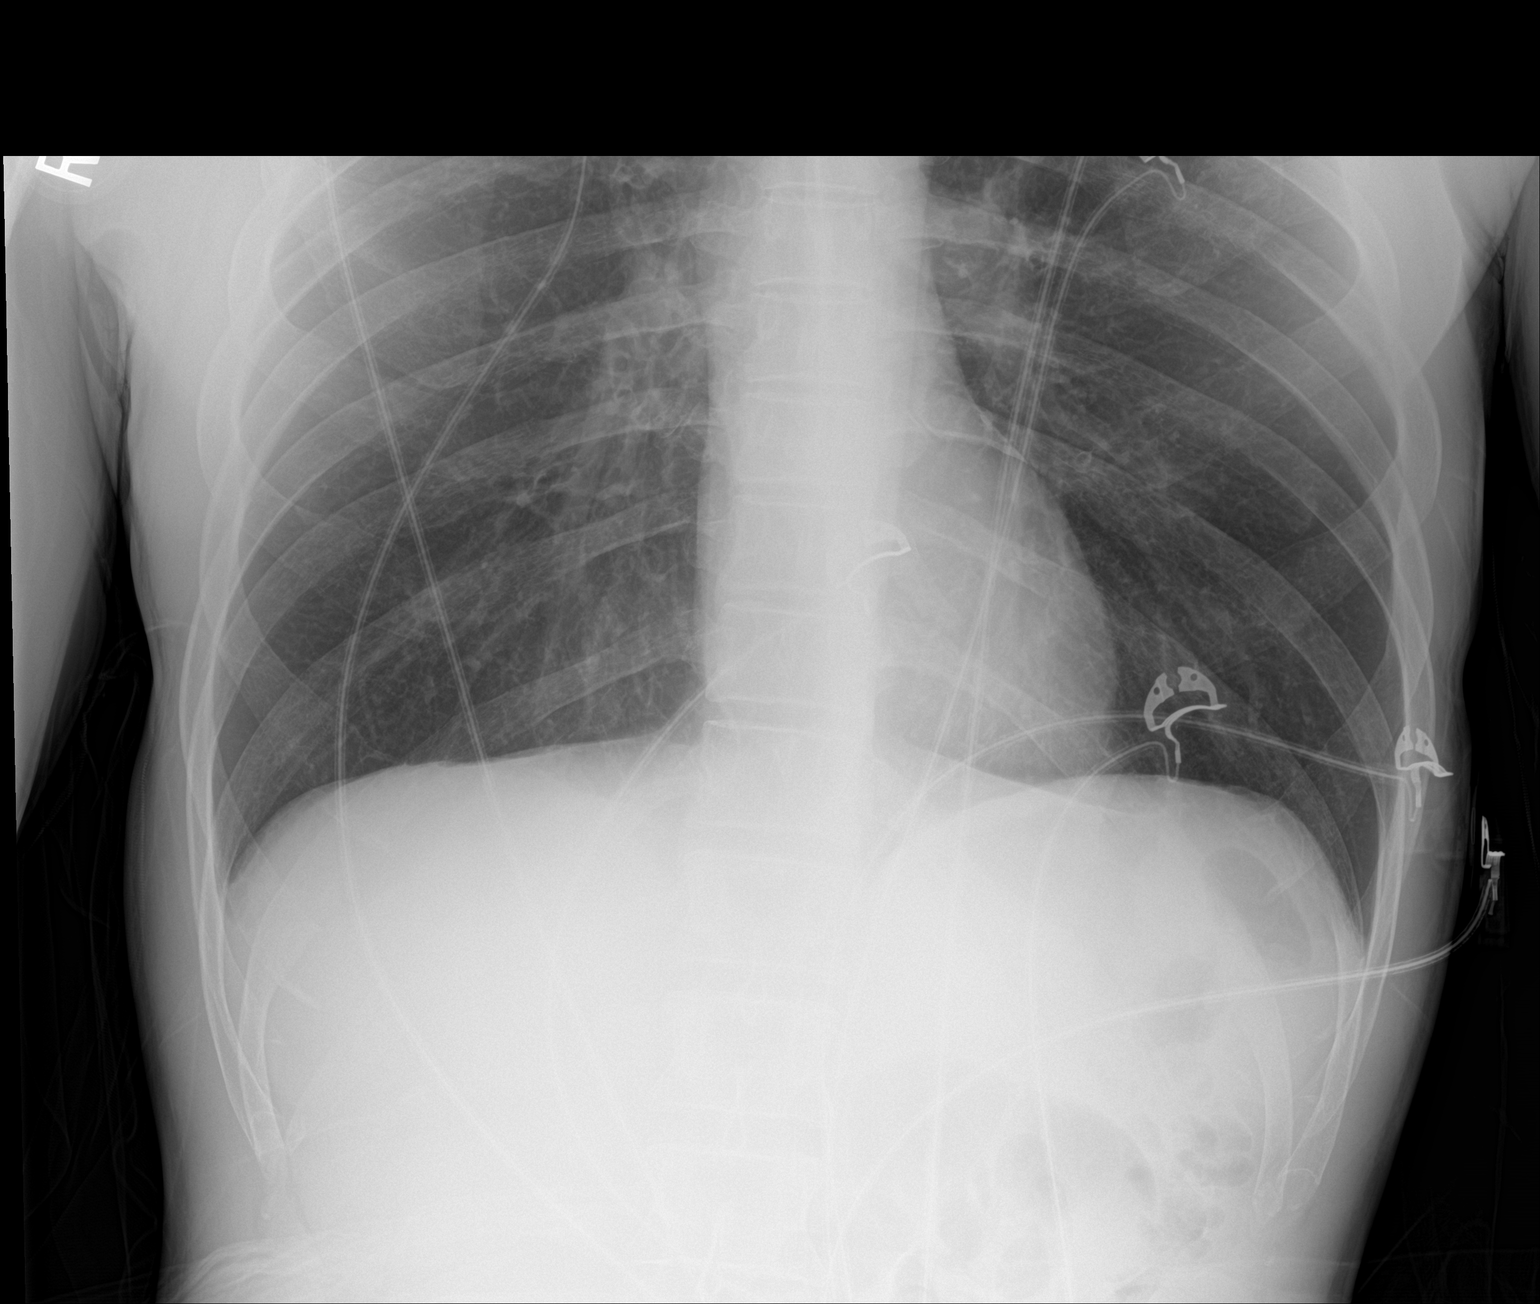

[2 of 2 positions shown; findings below may reference images not displayed]

FINDINGS: Cardiac size is within normal limits. Low position of diaphragms
suggests COPD. Lung fields are clear of any infiltrates or pulmonary
edema. There is no pleural effusion or pneumothorax. There is
anterior surgical fusion in the lower cervical spine.
IMPRESSION: COPD.  There are no new infiltrates or signs of pulmonary edema.

## 2023-05-13 IMAGING — CT CT ANGIO CHEST
2 of 6 series · 18 of 36 positions shown · IV contrast (OMNIPAQUE 350)
Comparison: None.
COMPARISON: None.

Addendum:
CLINICAL DATA: Mid chest pain, shortness of breath

EXAM:
CT ANGIOGRAPHY CHEST WITH CONTRAST
TECHNIQUE: Multidetector CT imaging of the chest was performed using the
standard protocol during bolus administration of intravenous
contrast. Multiplanar CT image reconstructions and MIPs were
obtained to evaluate the vascular anatomy.

[Series 5: thins · axial · 0.69mm/px · z∈[+1468,+1738]mm · 17 of 304 slices shown]
[im 17/304  lung]
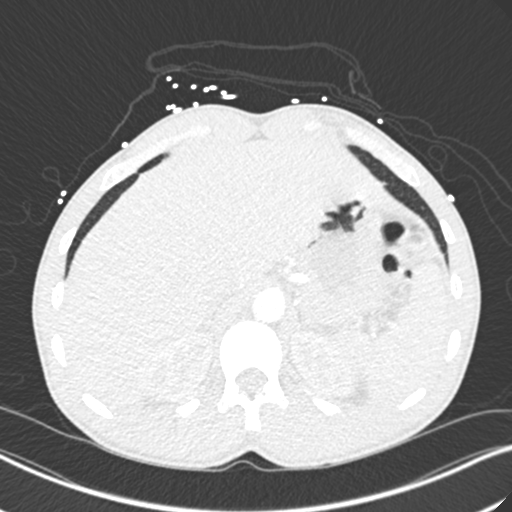
[im 34/304  mediastinal]
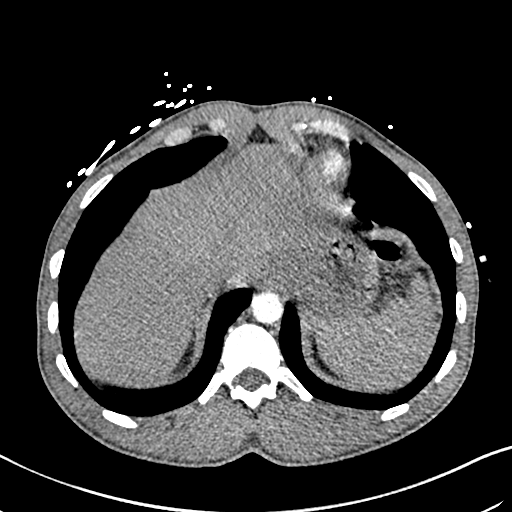
[im 51/304  lung]
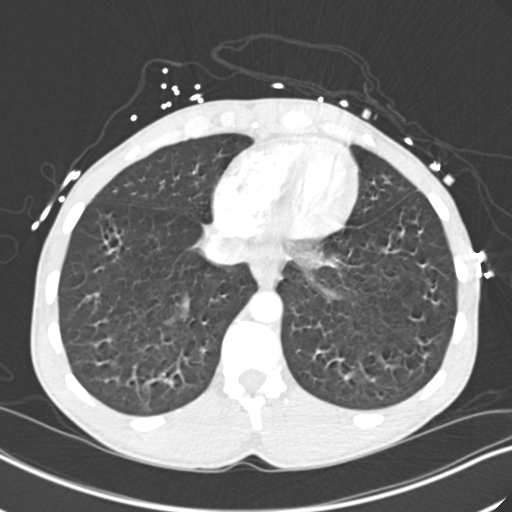
[im 68/304  mediastinal]
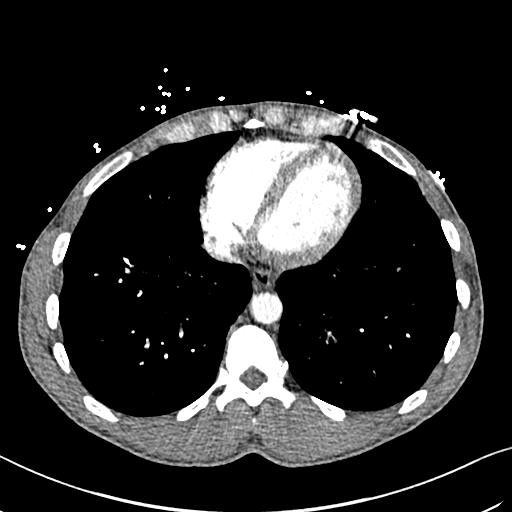
[im 85/304  lung]
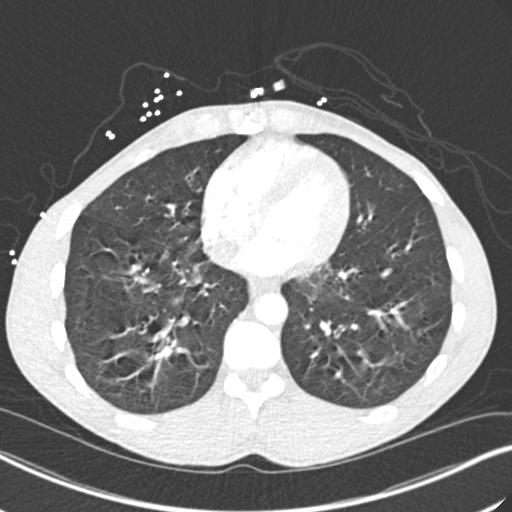
[im 102/304  mediastinal]
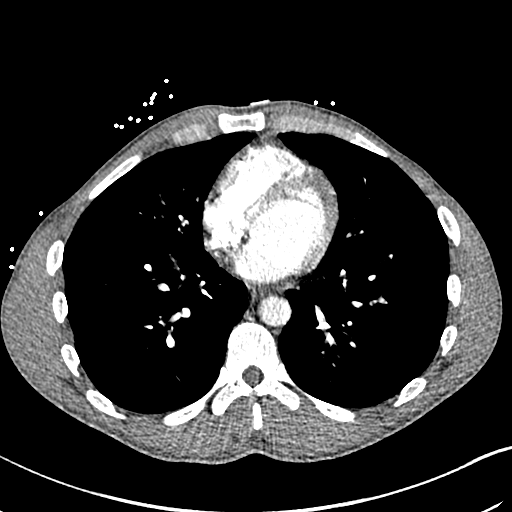
[im 118/304  lung]
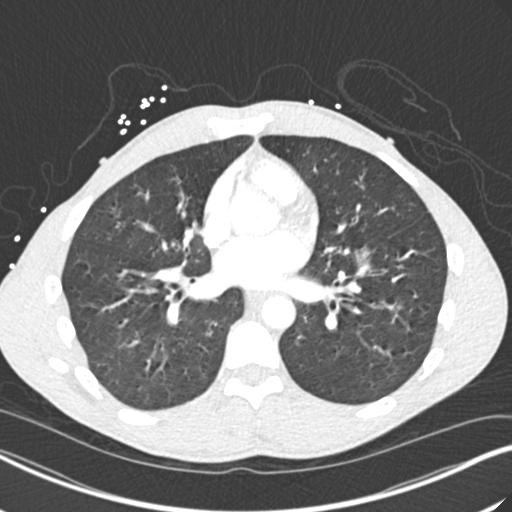
[im 135/304  mediastinal]
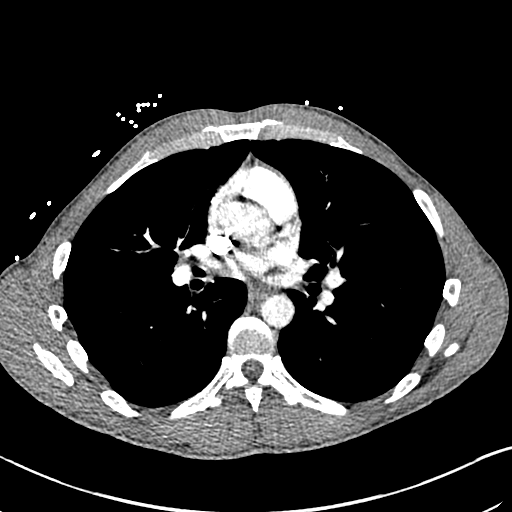
[im 152/304  lung]
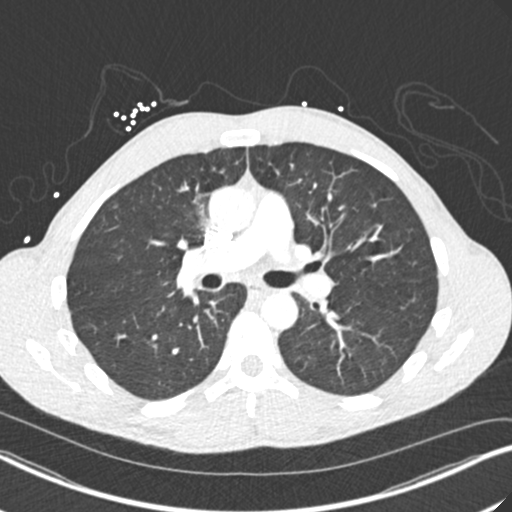
[im 169/304  mediastinal]
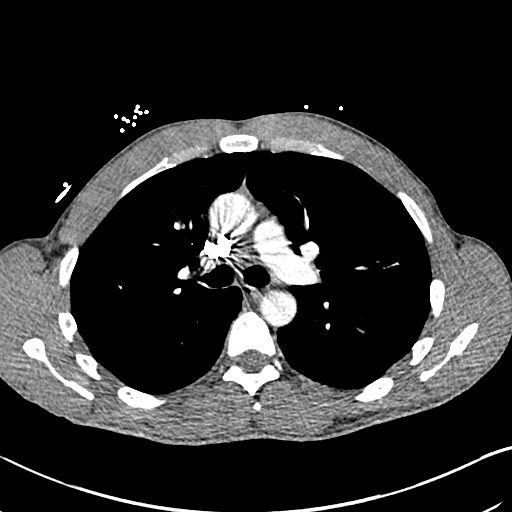
[im 186/304  lung]
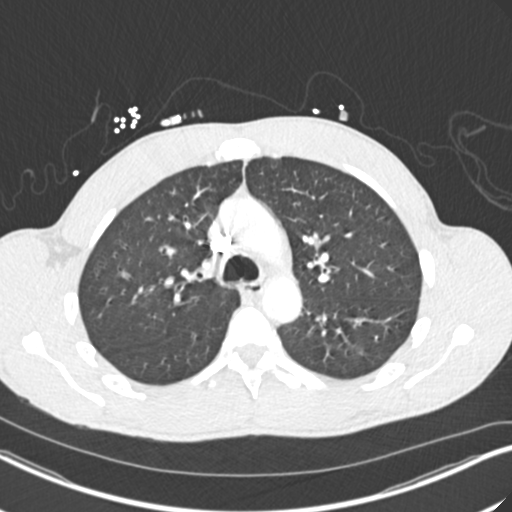
[im 203/304  mediastinal]
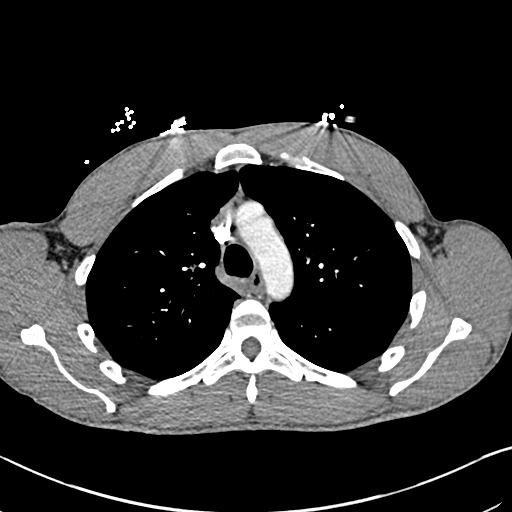
[im 219/304  lung]
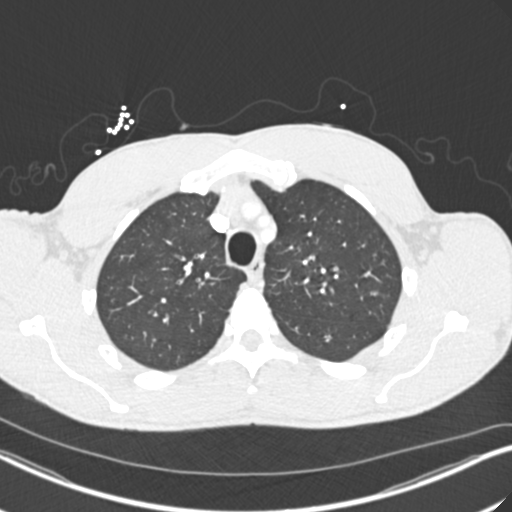
[im 236/304  mediastinal]
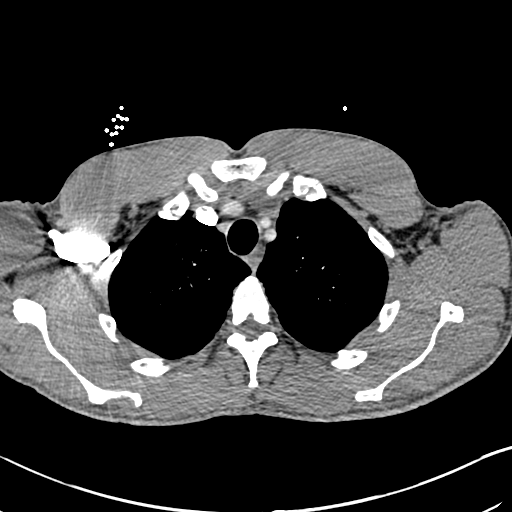
[im 253/304  lung]
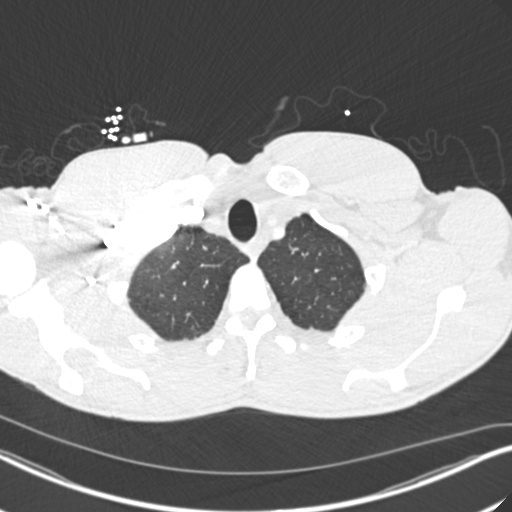
[im 270/304  mediastinal]
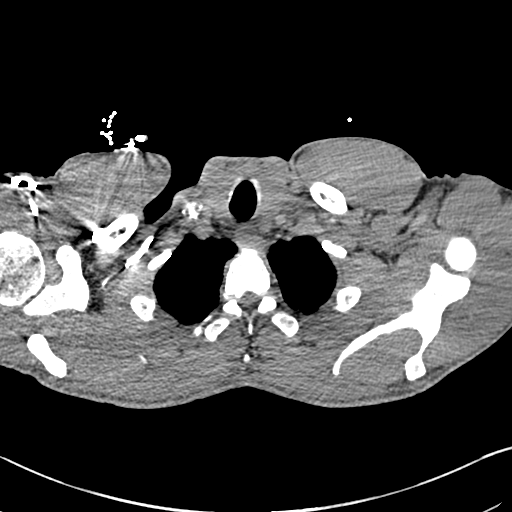
[im 287/304  lung]
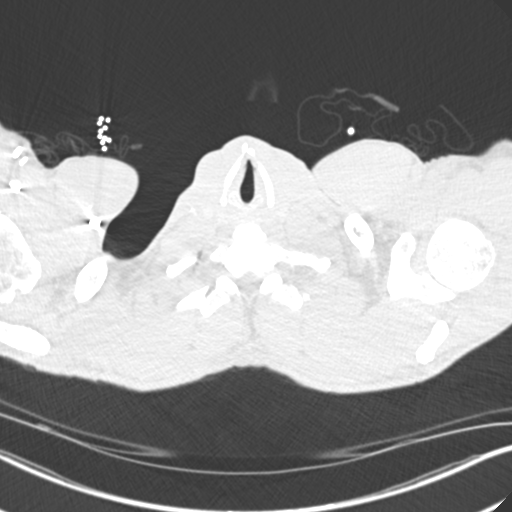

[Series 7: coronal mpr · coronal · 0.61mm/px · 1 of 151 slices shown]
[im 76/151  mediastinal]
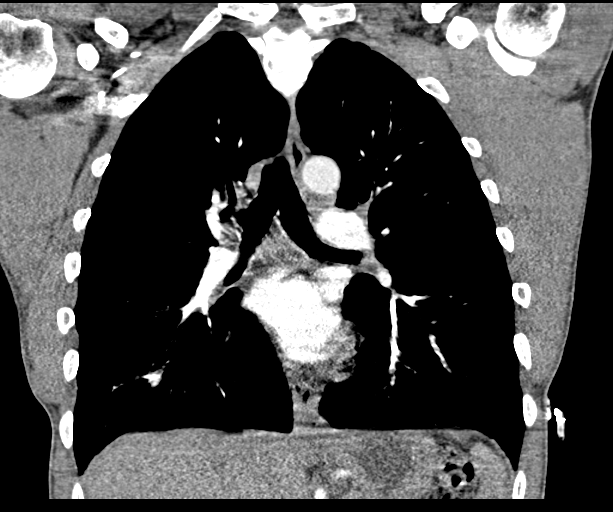

[18 of 36 positions shown; findings below may reference images not displayed]

RADIATION DOSE REDUCTION: This exam was performed according to the
departmental dose-optimization program which includes automated
exposure control, adjustment of the mA and/or kV according to
patient size and/or use of iterative reconstruction technique.

CONTRAST:  80mL OMNIPAQUE IOHEXOL 350 MG/ML SOLN
FINDINGS: Cardiovascular: No filling defects in the pulmonary arteries to
suggest pulmonary emboli. Heart is normal size. Aorta is normal
caliber.

Mediastinum/Nodes: No mediastinal, hilar, or axillary adenopathy.
Trachea and esophagus are unremarkable. Thyroid unremarkable.

Lungs/Pleura: Peribronchial thickening. No confluent airspace
opacities or effusions.

Upper Abdomen: Imaging into the upper abdomen demonstrates no acute
findings.

Musculoskeletal: Chest wall soft tissues are unremarkable. No acute
bony abnormality.

Review of the MIP images confirms the above findings.
IMPRESSION: No evidence of pulmonary embolus.

Peribronchial thickening compatible with bronchitis, acute versus
chronic.

ADDENDUM:
No pulmonary nodules.

*** End of Addendum ***
RADIATION DOSE REDUCTION: This exam was performed according to the
departmental dose-optimization program which includes automated
exposure control, adjustment of the mA and/or kV according to
patient size and/or use of iterative reconstruction technique.

CONTRAST:  80mL OMNIPAQUE IOHEXOL 350 MG/ML SOLN
FINDINGS: Cardiovascular: No filling defects in the pulmonary arteries to
suggest pulmonary emboli. Heart is normal size. Aorta is normal
caliber.

Mediastinum/Nodes: No mediastinal, hilar, or axillary adenopathy.
Trachea and esophagus are unremarkable. Thyroid unremarkable.

Lungs/Pleura: Peribronchial thickening. No confluent airspace
opacities or effusions.

Upper Abdomen: Imaging into the upper abdomen demonstrates no acute
findings.

Musculoskeletal: Chest wall soft tissues are unremarkable. No acute
bony abnormality.

Review of the MIP images confirms the above findings.
IMPRESSION: No evidence of pulmonary embolus.

Peribronchial thickening compatible with bronchitis, acute versus
chronic.
